# Patient Record
Sex: Female | Born: 1955 | Race: White | Hispanic: No | Smoking: Former smoker
Health system: Southern US, Community
[De-identification: ages and names within clinical notes are randomized; demographics above are authoritative.]

## PROBLEM LIST (undated history)

## (undated) DIAGNOSIS — M797 Fibromyalgia: Secondary | ICD-10-CM

## (undated) DIAGNOSIS — F32A Depression, unspecified: Secondary | ICD-10-CM

## (undated) DIAGNOSIS — F329 Major depressive disorder, single episode, unspecified: Secondary | ICD-10-CM

## (undated) DIAGNOSIS — IMO0002 Reserved for concepts with insufficient information to code with codable children: Secondary | ICD-10-CM

## (undated) DIAGNOSIS — K589 Irritable bowel syndrome without diarrhea: Secondary | ICD-10-CM

## (undated) DIAGNOSIS — B1920 Unspecified viral hepatitis C without hepatic coma: Secondary | ICD-10-CM

## (undated) DIAGNOSIS — G43709 Chronic migraine without aura, not intractable, without status migrainosus: Secondary | ICD-10-CM

## (undated) DIAGNOSIS — F419 Anxiety disorder, unspecified: Secondary | ICD-10-CM

## (undated) HISTORY — DX: Irritable bowel syndrome, unspecified: K58.9

## (undated) HISTORY — DX: Depression, unspecified: F32.A

## (undated) HISTORY — DX: Fibromyalgia: M79.7

## (undated) HISTORY — DX: Reserved for concepts with insufficient information to code with codable children: IMO0002

## (undated) HISTORY — PX: TUBAL LIGATION: SHX77

## (undated) HISTORY — PX: ABDOMINAL HYSTERECTOMY: SHX81

## (undated) HISTORY — DX: Unspecified viral hepatitis C without hepatic coma: B19.20

## (undated) HISTORY — DX: Anxiety disorder, unspecified: F41.9

## (undated) HISTORY — DX: Major depressive disorder, single episode, unspecified: F32.9

## (undated) HISTORY — DX: Chronic migraine without aura, not intractable, without status migrainosus: G43.709

---

## 1998-04-20 ENCOUNTER — Other Ambulatory Visit: Admission: RE | Admit: 1998-04-20 | Discharge: 1998-04-20 | Payer: Self-pay | Admitting: *Deleted

## 1999-03-08 ENCOUNTER — Other Ambulatory Visit: Admission: RE | Admit: 1999-03-08 | Discharge: 1999-03-08 | Payer: Self-pay | Admitting: *Deleted

## 1999-06-11 ENCOUNTER — Ambulatory Visit (HOSPITAL_BASED_OUTPATIENT_CLINIC_OR_DEPARTMENT_OTHER): Admission: RE | Admit: 1999-06-11 | Discharge: 1999-06-11 | Payer: Self-pay | Admitting: Oral Surgery

## 2000-09-15 ENCOUNTER — Other Ambulatory Visit: Admission: RE | Admit: 2000-09-15 | Discharge: 2000-09-15 | Payer: Self-pay | Admitting: Obstetrics and Gynecology

## 2002-01-11 ENCOUNTER — Encounter: Admission: RE | Admit: 2002-01-11 | Discharge: 2002-01-11 | Payer: Self-pay | Admitting: Family Medicine

## 2002-01-11 ENCOUNTER — Encounter: Payer: Self-pay | Admitting: Family Medicine

## 2005-02-19 ENCOUNTER — Ambulatory Visit (HOSPITAL_COMMUNITY): Admission: RE | Admit: 2005-02-19 | Discharge: 2005-02-19 | Payer: Self-pay | Admitting: Gastroenterology

## 2005-03-11 ENCOUNTER — Other Ambulatory Visit: Admission: RE | Admit: 2005-03-11 | Discharge: 2005-03-11 | Payer: Self-pay | Admitting: Obstetrics & Gynecology

## 2006-01-07 ENCOUNTER — Emergency Department (HOSPITAL_COMMUNITY): Admission: EM | Admit: 2006-01-07 | Discharge: 2006-01-07 | Payer: Self-pay | Admitting: Emergency Medicine

## 2009-10-08 ENCOUNTER — Encounter: Admission: RE | Admit: 2009-10-08 | Discharge: 2009-10-08 | Payer: Self-pay | Admitting: Orthopedic Surgery

## 2011-02-15 NOTE — Op Note (Signed)
[name], [name]               ACCOUNT NO.:  [id number]   MEDICAL RECORD NO.:  [id number]          PATIENT TYPE:  AMB   LOCATION:  ENDO                         FACILITY:  [facility]   PHYSICIAN:  Bernette Redbird, M.D.   DATE OF BIRTH:  1955-12-02   DATE OF PROCEDURE:  02/19/2005  DATE OF DISCHARGE:                                 OPERATIVE REPORT   PROCEDURE:  Colonoscopy.   INDICATIONS:  Shelley [name] is a 55 year old female with history of some IBS  symptoms and need for her initial colon cancer screening set examination.   FINDINGS:  Normal exam to the terminal ileum.   PROCEDURE:  The nature, purpose and risks of the procedure had been  discussed with the patient. She provided written consent.   The patient is on Zoloft and Xanax as an outpatient and required quite a bit  of medication for the procedure but was not difficult to sedate apart from  the large medication required. Sedation totaled f Phenergan 25 milligrams  IV, fentanyl 125 mcg IV, and Versed 13 milligrams IV without arrhythmias or  desaturation.   The Olympus adjustable tension pediatric video colonoscope was advanced to  the terminal ileum without significant difficulty and pullback was then  performed. The quality of the prep was very good it is felt that all areas  were well seen. We did turn the patient into the supine position and apply  external abdominal compression to facilitate advancement.   This was a normal examination. No polyps, cancer, colitis, vascular  malformations, or diverticulosis were noted and retroflexion of the rectum  and re-inspection of the rectum were unremarkable. No biopsies were  obtained. The patient tolerated the procedure well and there were no  apparent complications.   IMPRESSION:  Normal screening exam (V76.51).   PLAN:  Flexible sigmoidoscopy in 5 years for continued screening.      RB/MEDQ  D:  02/19/2005  T:  02/19/2005  Job:  [id number]   cc:   Vale Haven. Andrey Campanile, M.D.  [address]  [place]  [place] [address]  Fax: [phone]   Janine Limbo, M.D.  [address], [address]  [place]  [place] [address]  Fax: [phone]

## 2012-08-19 ENCOUNTER — Other Ambulatory Visit: Payer: Self-pay | Admitting: Family Medicine

## 2013-04-08 ENCOUNTER — Ambulatory Visit: Payer: Medicare Other

## 2013-04-08 ENCOUNTER — Ambulatory Visit (INDEPENDENT_AMBULATORY_CARE_PROVIDER_SITE_OTHER): Payer: Medicare Other | Admitting: Internal Medicine

## 2013-04-08 VITALS — BP 116/80 | HR 98 | Temp 98.2°F | Resp 16 | Ht 67.0 in | Wt 135.2 lb

## 2013-04-08 DIAGNOSIS — N10 Acute tubulo-interstitial nephritis: Secondary | ICD-10-CM

## 2013-04-08 DIAGNOSIS — B1920 Unspecified viral hepatitis C without hepatic coma: Secondary | ICD-10-CM | POA: Insufficient documentation

## 2013-04-08 DIAGNOSIS — K589 Irritable bowel syndrome without diarrhea: Secondary | ICD-10-CM | POA: Insufficient documentation

## 2013-04-08 DIAGNOSIS — M549 Dorsalgia, unspecified: Secondary | ICD-10-CM

## 2013-04-08 DIAGNOSIS — R339 Retention of urine, unspecified: Secondary | ICD-10-CM

## 2013-04-08 DIAGNOSIS — M797 Fibromyalgia: Secondary | ICD-10-CM | POA: Insufficient documentation

## 2013-04-08 DIAGNOSIS — F329 Major depressive disorder, single episode, unspecified: Secondary | ICD-10-CM | POA: Insufficient documentation

## 2013-04-08 DIAGNOSIS — F32A Depression, unspecified: Secondary | ICD-10-CM | POA: Insufficient documentation

## 2013-04-08 DIAGNOSIS — R110 Nausea: Secondary | ICD-10-CM

## 2013-04-08 DIAGNOSIS — R109 Unspecified abdominal pain: Secondary | ICD-10-CM

## 2013-04-08 DIAGNOSIS — F419 Anxiety disorder, unspecified: Secondary | ICD-10-CM | POA: Insufficient documentation

## 2013-04-08 DIAGNOSIS — K5900 Constipation, unspecified: Secondary | ICD-10-CM

## 2013-04-08 LAB — POCT CBC
Hemoglobin: 15.2 g/dL (ref 12.2–16.2)
MCH, POC: 31.5 pg — AB (ref 27–31.2)
MCV: 99.7 fL — AB (ref 80–97)
MPV: 9 fL (ref 0–99.8)
RBC: 4.82 M/uL (ref 4.04–5.48)
WBC: 10.6 10*3/uL — AB (ref 4.6–10.2)

## 2013-04-08 LAB — POCT UA - MICROSCOPIC ONLY
Casts, Ur, LPF, POC: NEGATIVE
Crystals, Ur, HPF, POC: NEGATIVE
Yeast, UA: NEGATIVE

## 2013-04-08 LAB — POCT URINALYSIS DIPSTICK
Bilirubin, UA: NEGATIVE
Glucose, UA: NEGATIVE
Nitrite, UA: POSITIVE
Spec Grav, UA: 1.01

## 2013-04-08 MED ORDER — CIPROFLOXACIN HCL 500 MG PO TABS: 500.0000 mg | ORAL_TABLET | Freq: Two times a day (BID) | ORAL | Status: DC

## 2013-04-08 NOTE — Progress Notes (Signed)
Subjective:    Patient ID: Shelley [name], female    DOB: [birth year], 57 y.o.   MRN: [id number]  Chief Complaint  Patient presents with  . Back Pain  . Abdominal Pain  . hard to urninate   HPI Shelley [name] is a 57 y.o. female presenting for abdominal pain and back pain since last night.  She felt bloated like she "needed to pass gas so bad" after getting out of jacuzzi.  Most severe in RLQ and wraps around her side to her back.  Does not extend into her groin.  Described as "stabbing."  Worse when she bends over.  Feels better when she lays down.  She is nauseated.    She is also experiencing urinary hesitancy and retention.  She has noticed a strong odor to urine.  Says her urine changes from dark to light yellow in color.    She battles constipation.  Uses laxatives and suppositories 1-2 times per week.  She had a bowel movement this morning but "not a good one."  Chronic narcotic use.    Denies fever, vomiting, dysuria, urinary frequency, urinary urgency.  20 year pack history. Denies alcohol and drug use.  On disability.  Past Medical History  Diagnosis Date  . Anxiety   . Depression   . Chronic migraine   . Hepatitis C   . IBS (irritable bowel syndrome)   . Fibromyalgia    Past Surgical History  Procedure Laterality Date  . Tubal ligation    . Abdominal hysterectomy     Prior to Admission medications   Medication Sig Start Date End Date Taking? Authorizing Provider  diazepam (VALIUM) 10 MG tablet Take 10 mg by mouth 4 (four) times daily.   Yes Historical Provider, MD  meperidine (DEMEROL) 50 MG tablet Take 50 mg by mouth as needed (migraine).   Yes Historical Provider, MD  morphine (MS CONTIN) 15 MG 12 hr tablet Take 15 mg by mouth 3 (three) times daily.   Yes Historical Provider, MD  promethazine (PHENERGAN) 25 MG tablet Take 25 mg by mouth as needed (migrane).   Yes Historical Provider, MD  sertraline (ZOLOFT) 100 MG tablet Take 200 mg by mouth daily.   Yes Historical  Provider, MD   Allergies  Allergen Reactions  . Penicillins Anaphylaxis  . Codeine Rash    Review of Systems As stated in HPI - otherwise negative.     Objective:   Physical Exam Filed Vitals:   04/08/13 1138  BP: 116/80  Pulse: 98  Temp: 98.2 F (36.8 C)  TempSrc: Oral  Resp: 16  Height: 5\' 7"  (1.702 m)  Weight: 135 lb 3.2 oz (61.326 kg)  SpO2: 97%   General:  WDWN female in no acute distress. Skin:  Soft, warm skin with good turgor.  No bruising or lesions present.  Cardiovascular: S1 and S2 distinct with no murmurs, rubs or gallops. Respiratory:  CTA with no adventitious sounds. Abdominal:  Soft abdomen with no visible pulsations or masses.  Bowel sounds adequate.  No bruits.  TTP in RLQ without rebound.  Positive CVA tenderness on right.  Negative murphy's sign.     [facility] reading (PRIMARY) by  Dr. Perrin Maltese.  Stool burden.  No kidney stones visible.     Assessment & Plan:   1. Abdominal pain - POCT CBC  2. Urinary retention - POCT UA - Microscopic Only - POCT CBC - POCT urinalysis dipstick  3. Nausea alone  4. Back pain - related to pyelonephritis -  DG Abd 1 View; Future  5. Acute pyelonephritis - pt to in 48h if no better sooner if worse - continue good hydration - ciprofloxacin (CIPRO) 500 MG tablet; Take 1 tablet (500 mg total) by mouth 2 (two) times daily.  Dispense: 20 tablet; Refill: 0  6. Constipation - 2nd to chronic narcotic use - suggested daily miralax or other methods to prevent constipation

## 2013-04-08 NOTE — Progress Notes (Signed)
I was directly involved with the patient care including the diagnosis and treatment plan.

## 2013-04-12 ENCOUNTER — Telehealth: Payer: Self-pay

## 2013-04-12 ENCOUNTER — Ambulatory Visit (INDEPENDENT_AMBULATORY_CARE_PROVIDER_SITE_OTHER): Payer: Medicare Other | Admitting: Family Medicine

## 2013-04-12 VITALS — BP 110/58 | HR 95 | Temp 97.5°F | Resp 18 | Ht 66.5 in | Wt 135.0 lb

## 2013-04-12 DIAGNOSIS — D7589 Other specified diseases of blood and blood-forming organs: Secondary | ICD-10-CM

## 2013-04-12 DIAGNOSIS — R3129 Other microscopic hematuria: Secondary | ICD-10-CM

## 2013-04-12 DIAGNOSIS — R109 Unspecified abdominal pain: Secondary | ICD-10-CM

## 2013-04-12 LAB — POCT CBC
HCT, POC: 50.4 % — AB (ref 37.7–47.9)
Lymph, poc: 2.7 (ref 0.6–3.4)
MCH, POC: 32.2 pg — AB (ref 27–31.2)
MCHC: 32.1 g/dL (ref 31.8–35.4)
POC Granulocyte: 6 (ref 2–6.9)
POC LYMPH PERCENT: 28.5 %L (ref 10–50)
RDW, POC: 13.9 %

## 2013-04-12 LAB — POCT UA - MICROSCOPIC ONLY
Casts, Ur, LPF, POC: NEGATIVE
Crystals, Ur, HPF, POC: NEGATIVE
Epithelial cells, urine per micros: NEGATIVE
Yeast, UA: NEGATIVE

## 2013-04-12 LAB — POCT URINALYSIS DIPSTICK
Ketones, UA: NEGATIVE
Leukocytes, UA: NEGATIVE
Protein, UA: NEGATIVE
Urobilinogen, UA: 0.2

## 2013-04-12 NOTE — Progress Notes (Addendum)
[facility] [address] [place] [address] [phone]  Date:  04/12/2013   Name:  Shelley [name]   DOB:  1955-11-11   MRN:  [id number]  PCP:  No primary provider on file.    Chief Complaint: Follow-up   History of Present Illness:  Shelley [name] is a 57 y.o. very pleasant female patient who presents with the following:  She was here on 04/08/13 with back back and abdominal pain- she was diagnosed with pyelonephritis, and was treated with cipro.  I do not see a urine culture on her chart.  Here today for a recheck- she was instructed to come in if not well.   She notes that she still has some abdominal pain, and some back pain.  However she is "a whole lot better."   She no longer notes any dysuria.   No fever or chills, she is eating well.  No nausea or vomiting   No history of abdominal surgery except for hysterectomy.   Her constipation has improved with treatment. She is using miralax and colace at home with success   She was also told that she had hematuria and a UTI in December- admits she did not take the whole course of abx at that time.  She does not think a urine culture was done then  She has been a smoker for some time.    Patient Active Problem List   Diagnosis Date Noted  . Fibromyalgia 04/08/2013  . Anxiety 04/08/2013  . Depression 04/08/2013  . Hepatitis C 04/08/2013  . IBS (irritable bowel syndrome) 04/08/2013    Past Medical History  Diagnosis Date  . Anxiety   . Depression   . Chronic migraine   . Hepatitis C   . IBS (irritable bowel syndrome)   . Fibromyalgia     Past Surgical History  Procedure Laterality Date  . Tubal ligation    . Abdominal hysterectomy      History  Substance Use Topics  . Smoking status: Current Every Day Smoker -- 1.00 packs/day for 20 years  . Smokeless tobacco: Not on file     Comment: uses smoke stick, just switched to half pack a day 7.10.14  . Alcohol Use: No    Family History   Problem Relation Age of Onset  . Cancer Mother   . Stroke Sister   . Hypertension Sister   . Hypertension Brother   . Diabetes Brother     Allergies  Allergen Reactions  . Penicillins Anaphylaxis  . Codeine Rash    Medication list has been reviewed and updated.  Current Outpatient Prescriptions on File Prior to Visit  Medication Sig Dispense Refill  . ciprofloxacin (CIPRO) 500 MG tablet Take 1 tablet (500 mg total) by mouth 2 (two) times daily.  20 tablet  0  . diazepam (VALIUM) 10 MG tablet Take 10 mg by mouth 4 (four) times daily.      . meperidine (DEMEROL) 50 MG tablet Take 50 mg by mouth as needed (migraine).      . morphine (MS CONTIN) 15 MG 12 hr tablet Take 15 mg by mouth 3 (three) times daily.      . promethazine (PHENERGAN) 25 MG tablet Take 25 mg by mouth as needed (migrane).      . sertraline (ZOLOFT) 100 MG tablet Take 200 mg by mouth daily.       No current facility-administered medications on file prior to visit.    Review of  Systems:  As per HPI- otherwise negative.   Physical Examination: Filed Vitals:   04/12/13 1354  BP: 110/58  Pulse: 95  Temp: 97.5 F (36.4 C)  Resp: 18   Filed Vitals:   04/12/13 1354  Height: 5' 6.5" (1.689 m)  Weight: 135 lb (61.236 kg)   Body mass index is 21.47 kg/(m^2). Ideal Body Weight: Weight in (lb) to have BMI = 25: 156.9  GEN: WDWN, NAD, Non-toxic, A & O x 3, looks well HEENT: Atraumatic, Normocephalic. Neck supple. No masses, No LAD. Ears and Nose: No external deformity. CV: RRR, No M/G/R. No JVD. No thrill. No extra heart sounds. PULM: CTA B, no wheezes, crackles, rhonchi. No retractions. No resp. distress. No accessory muscle use. ABD: S,  ND, +BS. No rebound. No HSM.  With some effort she is able to locate an area of tenderness in her right mid- abdomen. However, it is difficult for her to locate and her exam is benign with no significant tenderness to palpation EXTR: No c/c/e NEURO Normal gait.  PSYCH:  Normally interactive. Conversant. Not depressed or anxious appearing.  Calm demeanor.   Results for orders placed in visit on 04/12/13  POCT CBC      Result Value Range   WBC 9.3  4.6 - 10.2 K/uL   Lymph, poc 2.7  0.6 - 3.4   POC LYMPH PERCENT 28.5  10 - 50 %L   MID (cbc) 0.6  0 - 0.9   POC MID % 6.5  0 - 12 %M   POC Granulocyte 6.0  2 - 6.9   Granulocyte percent 65.0  37 - 80 %G   RBC 5.03  4.04 - 5.48 M/uL   Hemoglobin 16.2  12.2 - 16.2 g/dL   HCT, POC 44.0 (*) 10.2 - 47.9 %   MCV 100.1 (*) 80 - 97 fL   MCH, POC 32.2 (*) 27 - 31.2 pg   MCHC 32.1  31.8 - 35.4 g/dL   RDW, POC 72.5     Platelet Count, POC 188  142 - 424 K/uL   MPV 8.8  0 - 99.8 fL  POCT UA - MICROSCOPIC ONLY      Result Value Range   WBC, Ur, HPF, POC 0-2     RBC, urine, microscopic 0-3     Bacteria, U Microscopic neg     Mucus, UA neg     Epithelial cells, urine per micros neg     Crystals, Ur, HPF, POC neg     Casts, Ur, LPF, POC neg     Yeast, UA neg    POCT URINALYSIS DIPSTICK      Result Value Range   Color, UA yellow     Clarity, UA clear     Glucose, UA neg     Bilirubin, UA neg     Ketones, UA neg     Spec Grav, UA <=1.005     Blood, UA trace-lysed     pH, UA 5.5     Protein, UA neg     Urobilinogen, UA 0.2     Nitrite, UA neg     Leukocytes, UA Negative      Assessment and Plan: Abdominal  pain, other specified site - Plan: POCT CBC, POCT UA - Microscopic Only, POCT urinalysis dipstick, Urine culture, POCT UA - Microscopic Only, POCT urinalysis dipstick  Microhematuria  Macrocytosis - Plan: Vitamin B12, Folate  Will check urine culture.  Explained that she does still have microhematuria, and  that her smoking history increases her risk for bladder cancer.  Offered a recheck UA/ micro in a couple of weeks vs referral to urology. For now she prefers to recheck her UA and then decide.  Asked her to keep a close eye on her condition and let me know if not continuing to improve.  Discussed the  remote possibility that she could have appendicitis with her slight RLQ discomfort.  At this time she prefers not to do a CT, but will let me know if she does not continue to improve steadily.   Follow- up with urine culture.   Ordered recheck UA/ micro for her to do in 2 or 3 weeks Finish cipro.    Signed Abbe Amsterdam, MD  7/17- received B12 and folate- both ok.  Called and discussed with her.  Her hematacrit is a bit high- this is not a new problem for her.  She has been told to take aspirin in the past, and has been told that her HCT is high due to smoking.  She cannot donate blood due  To Hep c.  Will send letter to her- I cannot edit, but she is aware she cannot give blood.

## 2013-04-12 NOTE — Telephone Encounter (Signed)
PT STATES SHE WAS SEEN BY DR GUEST AND IS STILL IN A LOT OF PAIN. WOULD LIKE TO SPEAK WITH SOMEONE. PLEASE CALL [phone]

## 2013-04-12 NOTE — Patient Instructions (Addendum)
I will be in touch with your urine culture.   Continue to take the cipro as directed.

## 2013-04-12 NOTE — Telephone Encounter (Signed)
Called her to advise Dr Perrin Maltese has indicated if not improved to return, called her to advise.

## 2013-04-13 LAB — FOLATE: Folate: 12.6 ng/mL

## 2013-04-14 LAB — URINE CULTURE: Colony Count: NO GROWTH

## 2013-04-15 ENCOUNTER — Encounter: Payer: Self-pay | Admitting: Family Medicine

## 2013-04-15 NOTE — Addendum Note (Signed)
Addended by: Abbe Amsterdam C on: 04/15/2013 03:11 PM   Modules accepted: Orders

## 2013-07-06 ENCOUNTER — Telehealth: Payer: Self-pay

## 2013-07-06 DIAGNOSIS — R319 Hematuria, unspecified: Secondary | ICD-10-CM

## 2013-07-06 NOTE — Telephone Encounter (Signed)
Your note indicates repeat of labs for hematuria, patient prefers urology referral, is this okay? pended

## 2013-07-06 NOTE — Telephone Encounter (Signed)
Pt was seen by dr copland a few months ago and a urologist referral was discussed, pt would like to proceed with that referral

## 2013-07-06 NOTE — Telephone Encounter (Signed)
This is fine, signed referral for her.

## 2014-07-05 ENCOUNTER — Encounter (HOSPITAL_COMMUNITY): Payer: Self-pay | Admitting: Emergency Medicine

## 2014-07-05 ENCOUNTER — Inpatient Hospital Stay (HOSPITAL_COMMUNITY)
Admission: EM | Admit: 2014-07-05 | Discharge: 2014-07-09 | DRG: 872 | Disposition: A | Discharge status: Home / Self Care | Payer: Medicare Other | Attending: Internal Medicine | Admitting: Internal Medicine

## 2014-07-05 ENCOUNTER — Emergency Department (HOSPITAL_COMMUNITY): Payer: Medicare Other

## 2014-07-05 DIAGNOSIS — M797 Fibromyalgia: Secondary | ICD-10-CM

## 2014-07-05 DIAGNOSIS — N12 Tubulo-interstitial nephritis, not specified as acute or chronic: Secondary | ICD-10-CM | POA: Diagnosis present

## 2014-07-05 DIAGNOSIS — Z79891 Long term (current) use of opiate analgesic: Secondary | ICD-10-CM | POA: Diagnosis not present

## 2014-07-05 DIAGNOSIS — Z90710 Acquired absence of both cervix and uterus: Secondary | ICD-10-CM | POA: Diagnosis not present

## 2014-07-05 DIAGNOSIS — F17211 Nicotine dependence, cigarettes, in remission: Secondary | ICD-10-CM | POA: Diagnosis present

## 2014-07-05 DIAGNOSIS — Z79899 Other long term (current) drug therapy: Secondary | ICD-10-CM | POA: Diagnosis not present

## 2014-07-05 DIAGNOSIS — Z885 Allergy status to narcotic agent status: Secondary | ICD-10-CM

## 2014-07-05 DIAGNOSIS — Z888 Allergy status to other drugs, medicaments and biological substances status: Secondary | ICD-10-CM

## 2014-07-05 DIAGNOSIS — G43909 Migraine, unspecified, not intractable, without status migrainosus: Secondary | ICD-10-CM | POA: Diagnosis present

## 2014-07-05 DIAGNOSIS — B1920 Unspecified viral hepatitis C without hepatic coma: Secondary | ICD-10-CM

## 2014-07-05 DIAGNOSIS — F419 Anxiety disorder, unspecified: Secondary | ICD-10-CM | POA: Diagnosis present

## 2014-07-05 DIAGNOSIS — A419 Sepsis, unspecified organism: Secondary | ICD-10-CM | POA: Diagnosis present

## 2014-07-05 DIAGNOSIS — F329 Major depressive disorder, single episode, unspecified: Secondary | ICD-10-CM | POA: Diagnosis present

## 2014-07-05 DIAGNOSIS — K589 Irritable bowel syndrome without diarrhea: Secondary | ICD-10-CM | POA: Diagnosis present

## 2014-07-05 DIAGNOSIS — F32A Depression, unspecified: Secondary | ICD-10-CM | POA: Diagnosis present

## 2014-07-05 DIAGNOSIS — Z880 Allergy status to penicillin: Secondary | ICD-10-CM

## 2014-07-05 DIAGNOSIS — N390 Urinary tract infection, site not specified: Secondary | ICD-10-CM | POA: Diagnosis present

## 2014-07-05 DIAGNOSIS — D696 Thrombocytopenia, unspecified: Secondary | ICD-10-CM | POA: Diagnosis not present

## 2014-07-05 DIAGNOSIS — F17201 Nicotine dependence, unspecified, in remission: Secondary | ICD-10-CM

## 2014-07-05 LAB — CBC WITH DIFFERENTIAL/PLATELET
BASOS ABS: 0 10*3/uL (ref 0.0–0.1)
BASOS PCT: 0 % (ref 0–1)
EOS ABS: 0 10*3/uL (ref 0.0–0.7)
EOS PCT: 0 % (ref 0–5)
HEMATOCRIT: 42.3 % (ref 36.0–46.0)
Hemoglobin: 14.6 g/dL (ref 12.0–15.0)
Lymphocytes Relative: 10 % — ABNORMAL LOW (ref 12–46)
Lymphs Abs: 1.4 10*3/uL (ref 0.7–4.0)
MCH: 31.8 pg (ref 26.0–34.0)
MCHC: 34.5 g/dL (ref 30.0–36.0)
MCV: 92.2 fL (ref 78.0–100.0)
MONO ABS: 1.1 10*3/uL — AB (ref 0.1–1.0)
Monocytes Relative: 8 % (ref 3–12)
Neutro Abs: 11.1 10*3/uL — ABNORMAL HIGH (ref 1.7–7.7)
Neutrophils Relative %: 82 % — ABNORMAL HIGH (ref 43–77)
Platelets: 155 10*3/uL (ref 150–400)
RBC: 4.59 MIL/uL (ref 3.87–5.11)
RDW: 13.3 % (ref 11.5–15.5)
WBC: 13.7 10*3/uL — ABNORMAL HIGH (ref 4.0–10.5)

## 2014-07-05 LAB — URINE MICROSCOPIC-ADD ON

## 2014-07-05 LAB — URINALYSIS, ROUTINE W REFLEX MICROSCOPIC
Bilirubin Urine: NEGATIVE
GLUCOSE, UA: NEGATIVE mg/dL
KETONES UR: NEGATIVE mg/dL
Nitrite: NEGATIVE
PH: 5 (ref 5.0–8.0)
Protein, ur: 30 mg/dL — AB
Specific Gravity, Urine: 1.015 (ref 1.005–1.030)
Urobilinogen, UA: 0.2 mg/dL (ref 0.0–1.0)

## 2014-07-05 LAB — COMPREHENSIVE METABOLIC PANEL
ALT: 10 U/L (ref 0–35)
AST: 22 U/L (ref 0–37)
Albumin: 3.6 g/dL (ref 3.5–5.2)
Alkaline Phosphatase: 98 U/L (ref 39–117)
Anion gap: 18 — ABNORMAL HIGH (ref 5–15)
BILIRUBIN TOTAL: 0.6 mg/dL (ref 0.3–1.2)
BUN: 9 mg/dL (ref 6–23)
CHLORIDE: 99 meq/L (ref 96–112)
CO2: 18 mEq/L — ABNORMAL LOW (ref 19–32)
Calcium: 8.4 mg/dL (ref 8.4–10.5)
Creatinine, Ser: 0.75 mg/dL (ref 0.50–1.10)
GFR calc Af Amer: >90 mL/min (ref >90)
GFR calc non Af Amer: >90 mL/min (ref >90)
Glucose, Bld: 113 mg/dL — ABNORMAL HIGH (ref 70–99)
Potassium: 4 mEq/L (ref 3.7–5.3)
Sodium: 135 mEq/L — ABNORMAL LOW (ref 137–147)
Total Protein: 7.8 g/dL (ref 6.0–8.3)

## 2014-07-05 LAB — LIPASE, BLOOD: Lipase: 38 U/L (ref 11–59)

## 2014-07-05 LAB — I-STAT CG4 LACTIC ACID, ED: Lactic Acid, Venous: 1.71 mmol/L (ref 0.5–2.2)

## 2014-07-05 LAB — POC OCCULT BLOOD, ED: FECAL OCCULT BLD: POSITIVE — AB

## 2014-07-05 MED ORDER — SODIUM CHLORIDE 0.9 % IV BOLUS (SEPSIS)
1000.0000 mL | Freq: Once | INTRAVENOUS | Status: AC
  Administered 2014-07-05: 1000 mL via INTRAVENOUS

## 2014-07-05 MED ORDER — ONDANSETRON HCL 4 MG/2ML IJ SOLN
4.0000 mg | Freq: Once | INTRAMUSCULAR | Status: AC
  Administered 2014-07-05: 4 mg via INTRAVENOUS
  Filled 2014-07-05: qty 2

## 2014-07-05 MED ORDER — DEXTROSE 5 % IV SOLN
1.0000 g | Freq: Once | INTRAVENOUS | Status: AC
Start: 2014-07-05 — End: 2014-07-05
  Administered 2014-07-05: 1 g via INTRAVENOUS
  Filled 2014-07-05: qty 10

## 2014-07-05 NOTE — ED Notes (Addendum)
Hx of Hep C. Pt reports diarrhea x3 days, LLQ pain radiates to flank started last night 8/10, pt took fleets enema this morning with no relief, saw some pink tinge on tissue paper. Pt also has a migraine, sunglasses on. Last year pt hx of kidney infection. Nausea present. Denies vomiting.

## 2014-07-05 NOTE — ED Provider Notes (Signed)
CSN: [id number]     Arrival date & time 07/05/14  1753 History   First MD Initiated Contact with Patient 07/05/14 1906     Chief Complaint  Patient presents with  . Abdominal Pain     (Consider location/radiation/quality/duration/timing/severity/associated sxs/prior Treatment) HPI 58 year old female with left lower quadrant and left flank pain over the past 1 day. She had diarrhea a couple days ago and then recently went to the bathroom with a very hard stool. She has history of constipation so she put in an enema. She states the enema never came back out which got her concerned. The patient has noted fever and chills starting today. Nausea without vomiting. No dysuria or hematuria. The pain in her abdomen and flanks feel like the prior kidney infection she had last year. She never had dysuria that time either. She states occasionally she has mucus in her stool recently with some pink tinged stool when she wipes. Currently feels comfortable and denies needing pain at this time.  Past Medical History  Diagnosis Date  . Anxiety   . Depression   . Chronic migraine   . Hepatitis C   . IBS (irritable bowel syndrome)   . Fibromyalgia    Past Surgical History  Procedure Laterality Date  . Tubal ligation    . Abdominal hysterectomy     Family History  Problem Relation Age of Onset  . Cancer Mother   . Stroke Sister   . Hypertension Sister   . Hypertension Brother   . Diabetes Brother    History  Substance Use Topics  . Smoking status: Current Every Day Smoker -- 1.00 packs/day for 20 years  . Smokeless tobacco: Not on file     Comment: uses smoke stick, just switched to half pack a day 7.10.14  . Alcohol Use: No   OB History   Grav Para Term Preterm Abortions TAB SAB Ect Mult Living                 Review of Systems  Constitutional: Positive for fever and chills.  Respiratory: Negative for cough and shortness of breath.   Cardiovascular: Negative for chest pain.   Gastrointestinal: Positive for nausea, abdominal pain, diarrhea and constipation. Negative for vomiting.  Genitourinary: Negative for dysuria.  Musculoskeletal: Positive for back pain.  All other systems reviewed and are negative.     Allergies  Penicillins and Codeine  Home Medications   Prior to Admission medications   Medication Sig Start Date End Date Taking? Authorizing Provider  ciprofloxacin (CIPRO) 500 MG tablet Take 1 tablet (500 mg total) by mouth 2 (two) times daily. 04/08/13   Mancel Bale, PA-C  diazepam (VALIUM) 10 MG tablet Take 10 mg by mouth 4 (four) times daily.    Historical Provider, MD  meperidine (DEMEROL) 50 MG tablet Take 50 mg by mouth as needed (migraine).    Historical Provider, MD  morphine (MS CONTIN) 15 MG 12 hr tablet Take 15 mg by mouth 3 (three) times daily.    Historical Provider, MD  promethazine (PHENERGAN) 25 MG tablet Take 25 mg by mouth as needed (migrane).    Historical Provider, MD  sertraline (ZOLOFT) 100 MG tablet Take 200 mg by mouth daily.    Historical Provider, MD   BP 121/65  Pulse 89  Temp(Src) 100.1 F (37.8 C) (Oral)  Resp 16  SpO2 94% Physical Exam  Nursing note and vitals reviewed. Constitutional: She is oriented to person, place, and time. She appears  well-developed and well-nourished. No distress.  HENT:  Head: Normocephalic and atraumatic.  Right Ear: External ear normal.  Left Ear: External ear normal.  Nose: Nose normal.  Eyes: Right eye exhibits no discharge. Left eye exhibits no discharge.  Cardiovascular: Normal rate, regular rhythm and normal heart sounds.   Pulmonary/Chest: Effort normal and breath sounds normal.  Abdominal: Soft. There is tenderness (mild) in the left lower quadrant. There is CVA tenderness (mild, left worse than right).  Genitourinary: Rectal exam shows external hemorrhoid (non-thrombosed, nontender). Rectal exam shows no mass and no tenderness.  Hardly any stool on rectal exam  Neurological:  She is alert and oriented to person, place, and time.  Skin: Skin is warm and dry. She is not diaphoretic.    ED Course  Procedures (including critical care time) Labs Review Labs Reviewed  COMPREHENSIVE METABOLIC PANEL - Abnormal; Notable for the following:    Sodium 135 (*)    CO2 18 (*)    Glucose, Bld 113 (*)    Anion gap 18 (*)    All other components within normal limits  URINALYSIS, ROUTINE W REFLEX MICROSCOPIC - Abnormal; Notable for the following:    Color, Urine AMBER (*)    APPearance CLOUDY (*)    Hgb urine dipstick MODERATE (*)    Protein, ur 30 (*)    Leukocytes, UA MODERATE (*)    All other components within normal limits  URINE MICROSCOPIC-ADD ON - Abnormal; Notable for the following:    Casts HYALINE CASTS (*)    All other components within normal limits  CBC WITH DIFFERENTIAL - Abnormal; Notable for the following:    WBC 13.7 (*)    Neutrophils Relative % 82 (*)    Neutro Abs 11.1 (*)    Lymphocytes Relative 10 (*)    Monocytes Absolute 1.1 (*)    All other components within normal limits  POC OCCULT BLOOD, ED - Abnormal; Notable for the following:    Fecal Occult Bld POSITIVE (*)    All other components within normal limits  LIPASE, BLOOD  CBC WITH DIFFERENTIAL  CBC WITH DIFFERENTIAL  I-STAT CG4 LACTIC ACID, ED    Imaging Review Ct Abdomen Pelvis Wo Contrast  07/05/2014   CLINICAL DATA:  Acute onset of progressively worsening left lower quadrant abdominal pain, starting yesterday at home. Initial encounter.  EXAM: CT ABDOMEN AND PELVIS WITHOUT CONTRAST  TECHNIQUE: Multidetector CT imaging of the abdomen and pelvis was performed following the standard protocol without IV contrast.  COMPARISON:  Abdominal radiograph performed 04/08/2013  FINDINGS: Minimal bibasilar atelectasis is noted.  A 0.9 cm nonspecific hypodensity is noted at the right hepatic lobe; this could reflect a small cyst. The liver and spleen are otherwise unremarkable. The gallbladder is  within normal limits. The pancreas and adrenal glands are unremarkable.  There is minimal left-sided perinephric stranding, and mild thickening of the wall of the left renal pelvis. This could reflect left-sided pyelonephritis, given the patient's symptoms. The right kidney is grossly unremarkable in appearance. There is no evidence of hydronephrosis. No renal or ureteral stones are seen.  No free fluid is identified. The small bowel is unremarkable in appearance. The stomach is within normal limits. No acute vascular abnormalities are seen. Mild scattered calcification is seen along the abdominal aorta and its branches.  The appendix is normal in caliber and contains contrast and air, without evidence for appendicitis. Contrast progresses to the level of the transverse colon. The colon is partially filled with fluid  and air, and is unremarkable in appearance.  The bladder is mildly distended and grossly unremarkable. The patient is status post hysterectomy. The ovaries are relatively symmetric. No suspicious adnexal masses are identified. No inguinal lymphadenopathy is seen.  No acute osseous abnormalities are identified.  IMPRESSION: 1. Minimal left-sided perinephric stranding, and mild thickening of the wall of the left renal pelvis, could reflect mild left-sided pyelonephritis, given the patient's symptoms. 2. Question of tiny hepatic cyst. 3. Mild scattered calcification along the abdominal aorta and its branches.   Electronically Signed   By: Garald Balding M.D.   On: 07/05/2014 22:43     EKG Interpretation None      MDM   Final diagnoses:  Pyelonephritis    Patient is well-appearing here with a mild left-sided abdominal and flank pain. Her CT and urine are consistent with left-sided pyelonephritis. Given her tachycardia, fever, and elevated white blood cell count, she meets criteria for sepsis. After discussion with the patient she prefers to be admitted with IV antibiotics and fluids. Her blood  pressures remained normal. I discussed her case with the hospitalist, will admit for IV antibiotics and supportive care.    Ephraim Hamburger, MD 07/05/14 [phone]

## 2014-07-05 NOTE — ED Notes (Signed)
Pt off the floor to CT.

## 2014-07-05 NOTE — H&P (Signed)
[facility]                                                                                    Patient Demographics  Shelley [name], is a 58 y.o. female  CSN: [id number]  MRN: [id number]  DOB - [birth year]  Admit Date - 07/05/2014  Outpatient Primary MD for the patient is  Melinda Crutch, MD   With History of -  Past Medical History  Diagnosis Date  . Anxiety   . Depression   . Chronic migraine   . Hepatitis C   . IBS (irritable bowel syndrome)   . Fibromyalgia       Past Surgical History  Procedure Laterality Date  . Tubal ligation    . Abdominal hysterectomy      in for   Chief Complaint  Patient presents with  . Abdominal Pain     HPI  Shelley [name]  is a 58 y.o. female, with past medical history significant for hepatitis C, anxiety and IBS presenting with one-day history of left-sided flank pain similar to a pyelonephritis episode on the right that happened around one year ago. However today the patient noticed fever and nausea without vomiting. Patient has been having regular bowel movements and with alternating diarrhea and constipation. No history of cough chest pains or shortness of breath . Patient denies any dysuria or blood in the urine .    Review of Systems    In addition to the HPI above,  No Fever-chills, No Headache, No changes with Vision or hearing, No problems swallowing food or Liquids, No Chest pain, Cough or Shortness of Breath, No Abdominal pain, mild nausea, no vomiting No Blood in stool or Urine, No dysuria, No new skin rashes or bruises, No new joints pains-aches,  No new weakness, tingling, numbness in any extremity, No recent weight gain or loss, No polyuria, polydypsia or polyphagia, No significant Mental Stressors.  A full 10 point Review of Systems was done, except as stated above, all other Review of Systems were negative.   Social History History  Substance Use Topics  . Smoking status: Current Every Day  Smoker -- 1.00 packs/day for 20 years  . Smokeless tobacco: Not on file     Comment: uses smoke stick, just switched to half pack a day 7.10.14  . Alcohol Use: No     Family History Family History  Problem Relation Age of Onset  . Cancer Mother   . Stroke Sister   . Hypertension Sister   . Hypertension Brother   . Diabetes Brother      Prior to Admission medications   Medication Sig Start Date End Date Taking? Authorizing Provider  diazepam (VALIUM) 10 MG tablet Take 10 mg by mouth 4 (four) times daily.   Yes Historical Provider, MD  meperidine (DEMEROL) 50 MG tablet Take 50 mg by mouth as needed (migraine).   Yes Historical Provider, MD  morphine (MS CONTIN) 15 MG 12 hr tablet Take 15 mg by mouth 3 (three) times daily.   Yes Historical Provider, MD  promethazine (PHENERGAN) 25 MG tablet Take 25 mg by mouth as needed (migrane).   Yes Historical  Provider, MD  sertraline (ZOLOFT) 100 MG tablet Take 200 mg by mouth daily.   Yes Historical Provider, MD    Allergies  Allergen Reactions  . Penicillins Anaphylaxis  . Lyrica [Pregabalin] Other (See Comments)    Walks into walls with this med  . Codeine Rash  . Tramadol Nausea And Vomiting and Rash    Physical Exam  Vitals  Blood pressure 111/57, pulse 89, temperature 100.1 F (37.8 C), temperature source Oral, resp. rate 17, SpO2 96.00%.   1. General elderly white lady, very pleasant, in pain  2. Normal affect and insight, Not Suicidal or Homicidal, Awake Alert, Oriented X 3.  3. No F.N deficits grossly, ALL C.Nerves Intact,  4. Ears and Eyes appear Normal, Conjunctivae clear, PERRLA. Moist Oral Mucosa.  5. Supple Neck, No JVD, No cervical lymphadenopathy appriciated, No Carotid Bruits.  6. Symmetrical Chest wall movement, Good air movement bilaterally, CTAB.  7. RRR, No Gallops, Rubs or Murmurs, No Parasternal Heave.  8. Positive Bowel Sounds, Abdomen Soft, positive left lower quadrant tenderness, No organomegaly  appriciated,No rebound -guarding or rigidity./Left CVA tenderness  9.  No Cyanosis, Normal Skin Turgor, No Skin Rash or Bruise.  10. Good muscle tone,  joints appear normal , no effusions, Normal ROM.  11. No Palpable Lymph Nodes in Neck or Axillae    Data Review  CBC  Recent Labs Lab 07/05/14 2028  WBC 13.7*  HGB 14.6  HCT 42.3  PLT 155  MCV 92.2  MCH 31.8  MCHC 34.5  RDW 13.3  LYMPHSABS 1.4  MONOABS 1.1*  EOSABS 0.0  BASOSABS 0.0   ------------------------------------------------------------------------------------------------------------------  Chemistries   Recent Labs Lab 07/05/14 1853  NA 135*  K 4.0  CL 99  CO2 18*  GLUCOSE 113*  BUN 9  CREATININE 0.75  CALCIUM 8.4  AST 22  ALT 10  ALKPHOS 98  BILITOT 0.6   ------------------------------------------------------------------------------------------------------------------ CrCl is unknown because both a height and weight (above a minimum accepted value) are required for this calculation. ------------------------------------------------------------------------------------------------------------------ No results found for this basename: TSH, T4TOTAL, FREET3, T3FREE, THYROIDAB,  in the last 72 hours   Coagulation profile No results found for this basename: INR, PROTIME,  in the last 168 hours ------------------------------------------------------------------------------------------------------------------- No results found for this basename: DDIMER,  in the last 72 hours -------------------------------------------------------------------------------------------------------------------  Cardiac Enzymes No results found for this basename: CK, CKMB, TROPONINI, MYOGLOBIN,  in the last 168 hours ------------------------------------------------------------------------------------------------------------------ No components found with this basename: POCBNP,     ---------------------------------------------------------------------------------------------------------------  Urinalysis    Component Value Date/Time   COLORURINE AMBER* 07/05/2014 1849   APPEARANCEUR CLOUDY* 07/05/2014 1849   LABSPEC 1.015 07/05/2014 1849   PHURINE 5.0 07/05/2014 [place] 07/05/2014 1849   HGBUR MODERATE* 07/05/2014 [place] 07/05/2014 1849   BILIRUBINUR neg 04/12/2013 [place] 07/05/2014 1849   PROTEINUR 30* 07/05/2014 1849   PROTEINUR neg 04/12/2013 1516   UROBILINOGEN 0.2 07/05/2014 1849   UROBILINOGEN 0.2 04/12/2013 1516   NITRITE NEGATIVE 07/05/2014 1849   NITRITE neg 04/12/2013 [place]* 07/05/2014 1849    ----------------------------------------------------------------------------------------------------------------  Imaging results:   Ct Abdomen Pelvis Wo Contrast  07/05/2014   CLINICAL DATA:  Acute onset of progressively worsening left lower quadrant abdominal pain, starting yesterday at home. Initial encounter.  EXAM: CT ABDOMEN AND PELVIS WITHOUT CONTRAST  TECHNIQUE: Multidetector CT imaging of the abdomen and pelvis was performed following the standard protocol without IV contrast.  COMPARISON:  Abdominal radiograph performed 04/08/2013  FINDINGS: Minimal bibasilar atelectasis is noted.  A 0.9 cm nonspecific hypodensity is noted at the right hepatic lobe; this could reflect a small cyst. The liver and spleen are otherwise unremarkable. The gallbladder is within normal limits. The pancreas and adrenal glands are unremarkable.  There is minimal left-sided perinephric stranding, and mild thickening of the wall of the left renal pelvis. This could reflect left-sided pyelonephritis, given the patient's symptoms. The right kidney is grossly unremarkable in appearance. There is no evidence of hydronephrosis. No renal or ureteral stones are seen.  No free fluid is identified. The small bowel is  unremarkable in appearance. The stomach is within normal limits. No acute vascular abnormalities are seen. Mild scattered calcification is seen along the abdominal aorta and its branches.  The appendix is normal in caliber and contains contrast and air, without evidence for appendicitis. Contrast progresses to the level of the transverse colon. The colon is partially filled with fluid and air, and is unremarkable in appearance.  The bladder is mildly distended and grossly unremarkable. The patient is status post hysterectomy. The ovaries are relatively symmetric. No suspicious adnexal masses are identified. No inguinal lymphadenopathy is seen.  No acute osseous abnormalities are identified.  IMPRESSION: 1. Minimal left-sided perinephric stranding, and mild thickening of the wall of the left renal pelvis, could reflect mild left-sided pyelonephritis, given the patient's symptoms. 2. Question of tiny hepatic cyst. 3. Mild scattered calcification along the abdominal aorta and its branches.   Electronically Signed   By: Garald Balding M.D.   On: 07/05/2014 22:43      Assessment & Plan  1. 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 History of IBS and fibromyalgia  Plan  IV Rocephin Check urine culture Pain control   DVT Prophylaxis Lovenox  AM Labs Ordered, also please review Full Orders    Code Status full  Disposition Plan: Home  Time spent in minutes : 32 minutes  Condition GUARDED  @SIGNATURE @

## 2014-07-05 NOTE — ED Notes (Signed)
Pt returned from CT °

## 2014-07-06 DIAGNOSIS — A419 Sepsis, unspecified organism: Secondary | ICD-10-CM | POA: Diagnosis present

## 2014-07-06 DIAGNOSIS — F419 Anxiety disorder, unspecified: Secondary | ICD-10-CM

## 2014-07-06 DIAGNOSIS — F329 Major depressive disorder, single episode, unspecified: Secondary | ICD-10-CM

## 2014-07-06 DIAGNOSIS — M797 Fibromyalgia: Secondary | ICD-10-CM

## 2014-07-06 DIAGNOSIS — B1920 Unspecified viral hepatitis C without hepatic coma: Secondary | ICD-10-CM

## 2014-07-06 DIAGNOSIS — F17201 Nicotine dependence, unspecified, in remission: Secondary | ICD-10-CM

## 2014-07-06 DIAGNOSIS — N12 Tubulo-interstitial nephritis, not specified as acute or chronic: Secondary | ICD-10-CM

## 2014-07-06 LAB — CBC
HCT: 40.7 % (ref 36.0–46.0)
HEMOGLOBIN: 13.9 g/dL (ref 12.0–15.0)
MCH: 31.3 pg (ref 26.0–34.0)
MCHC: 34.2 g/dL (ref 30.0–36.0)
MCV: 91.7 fL (ref 78.0–100.0)
Platelets: 140 10*3/uL — ABNORMAL LOW (ref 150–400)
RBC: 4.44 MIL/uL (ref 3.87–5.11)
RDW: 13.2 % (ref 11.5–15.5)
WBC: 13.7 10*3/uL — ABNORMAL HIGH (ref 4.0–10.5)

## 2014-07-06 LAB — CREATININE, SERUM
Creatinine, Ser: 0.73 mg/dL (ref 0.50–1.10)
GFR calc Af Amer: >90 mL/min (ref >90)
GFR calc non Af Amer: >90 mL/min (ref >90)

## 2014-07-06 MED ORDER — ACETAMINOPHEN 650 MG RE SUPP: 650.0000 mg | Freq: Four times a day (QID) | RECTAL | Status: DC | PRN

## 2014-07-06 MED ORDER — DEXTROSE 5 % IV SOLN
1.0000 g | INTRAVENOUS | Status: DC
  Administered 2014-07-06 – 2014-07-08 (×3): 1 g via INTRAVENOUS
  Filled 2014-07-06 (×3): qty 10

## 2014-07-06 MED ORDER — ENOXAPARIN SODIUM 40 MG/0.4ML ~~LOC~~ SOLN
40.0000 mg | SUBCUTANEOUS | Status: DC
  Administered 2014-07-06 – 2014-07-07 (×2): 40 mg via SUBCUTANEOUS
  Filled 2014-07-06 (×3): qty 0.4

## 2014-07-06 MED ORDER — MEPERIDINE HCL 50 MG PO TABS: 50.0000 mg | ORAL_TABLET | ORAL | Status: DC | PRN

## 2014-07-06 MED ORDER — PROMETHAZINE HCL 25 MG PO TABS: 25.0000 mg | ORAL_TABLET | Freq: Every day | ORAL | Status: DC | PRN

## 2014-07-06 MED ORDER — MORPHINE SULFATE ER 15 MG PO TBCR
15.0000 mg | EXTENDED_RELEASE_TABLET | Freq: Three times a day (TID) | ORAL | Status: DC
Start: 2014-07-06 — End: 2014-07-06

## 2014-07-06 MED ORDER — SERTRALINE HCL 100 MG PO TABS
200.0000 mg | ORAL_TABLET | Freq: Every day | ORAL | Status: DC
  Administered 2014-07-06 – 2014-07-08 (×3): 200 mg via ORAL
  Filled 2014-07-06 (×4): qty 2

## 2014-07-06 MED ORDER — ONDANSETRON HCL 4 MG/2ML IJ SOLN: 4.0000 mg | Freq: Four times a day (QID) | INTRAMUSCULAR | Status: DC | PRN

## 2014-07-06 MED ORDER — ZOLPIDEM TARTRATE 5 MG PO TABS
5.0000 mg | ORAL_TABLET | Freq: Every evening | ORAL | Status: DC | PRN
  Filled 2014-07-06: qty 1

## 2014-07-06 MED ORDER — DIAZEPAM 5 MG PO TABS
10.0000 mg | ORAL_TABLET | Freq: Four times a day (QID) | ORAL | Status: DC
  Administered 2014-07-06 – 2014-07-08 (×12): 10 mg via ORAL
  Filled 2014-07-06 (×12): qty 2

## 2014-07-06 MED ORDER — SODIUM CHLORIDE 0.9 % IV SOLN
INTRAVENOUS | Status: DC
  Administered 2014-07-06 (×2): via INTRAVENOUS

## 2014-07-06 MED ORDER — ACETAMINOPHEN 325 MG PO TABS
650.0000 mg | ORAL_TABLET | Freq: Four times a day (QID) | ORAL | Status: DC | PRN
  Administered 2014-07-06 – 2014-07-08 (×7): 650 mg via ORAL
  Filled 2014-07-06 (×7): qty 2

## 2014-07-06 MED ORDER — MORPHINE SULFATE ER 15 MG PO TBCR
15.0000 mg | EXTENDED_RELEASE_TABLET | Freq: Three times a day (TID) | ORAL | Status: DC
  Administered 2014-07-06 – 2014-07-07 (×4): 15 mg via ORAL
  Filled 2014-07-06 (×5): qty 1

## 2014-07-06 MED ORDER — ONDANSETRON HCL 4 MG PO TABS: 4.0000 mg | ORAL_TABLET | Freq: Four times a day (QID) | ORAL | Status: DC | PRN

## 2014-07-06 MED ORDER — OXYCODONE HCL 5 MG PO TABS
5.0000 mg | ORAL_TABLET | ORAL | Status: DC | PRN
Start: 2014-07-06 — End: 2014-07-09

## 2014-07-06 NOTE — Progress Notes (Signed)
PROGRESS NOTE  Shelley [name] [id number]:[id number] DOB: 04/19/1956 DOA: 07/05/2014 PCP:  Melinda Crutch, MD  HPI: 58 y.o. female, with past medical history significant for hepatitis C, anxiety and IBS presenting with one-day history of left-sided flank pain, found to have pyelonephritis on CT scan.  Subjective/ 24 H Interval events - feeling a bit better this morning, still with some subjective chills - afebrile this morning  Assessment/Plan: Sepsis due to UTI - low grade fever in the ED but febrile at home prior to presentation, with tachycardia, leukocytosis and a source - continue Ceftriaxone, urine cultures pending.  - no history of MDRs Chronic pain and anxiety in the setting of fibromyalgia, continue home regimen Tobacco abuse, in remission - quit E cig a month ago, congratulated patient on abstinence.   Diet: heart healthy Fluids: nNS DVT Prophylaxis: Lovenox  Code Status: Full Family Communication: d/w patient  Disposition Plan: home when ready   Consultants:  None   Procedures:  None    Antibiotics Ceftriaxone 10/6 >>  Studies  Filed Vitals:   07/06/14 0100 07/06/14 0500 07/06/14 0755 07/06/14 1353  BP: 101/46 92/58  100/62  Pulse: 84 67  72  Temp: 99.5 F (37.5 C) 98.2 F (36.8 C)  100.6 F (38.1 C)  TempSrc: Oral Oral  Oral  Resp: 16 18  20   Height:   5\' 6"  (1.676 m)   SpO2: 94% 93%  97%    Intake/Output Summary (Last 24 hours) at 07/06/14 1408 Last data filed at 07/06/14 0532  Gross per 24 hour  Intake 441.67 ml  Output      0 ml  Net 441.67 ml   Exam:  General:  NAD  Cardiovascular: RRR  Respiratory: CTA biL  Abdomen: soft, non tender  MSK: CVA tenderness on left   Data Reviewed: Basic Metabolic Panel:  Recent Labs Lab 07/05/14 1853 07/06/14 0240  NA 135*  --   K 4.0  --   CL 99  --   CO2 18*  --   GLUCOSE 113*  --   BUN 9  --   CREATININE 0.75 0.73  CALCIUM 8.4  --    Liver Function Tests:  Recent Labs Lab 07/05/14 1853   AST 22  ALT 10  ALKPHOS 98  BILITOT 0.6  PROT 7.8  ALBUMIN 3.6    Recent Labs Lab 07/05/14 1853  LIPASE 38   CBC:  Recent Labs Lab 07/05/14 2028 07/06/14 0240  WBC 13.7* 13.7*  NEUTROABS 11.1*  --   HGB 14.6 13.9  HCT 42.3 40.7  MCV 92.2 91.7  PLT 155 140*   Studies: Ct Abdomen Pelvis Wo Contrast 07/05/2014 1. Minimal left-sided perinephric stranding, and mild thickening of the wall of the left renal pelvis, could reflect mild left-sided pyelonephritis, given the patient's symptoms. 2. Question of tiny hepatic cyst. 3. Mild scattered calcification along the abdominal aorta and its branches.  Scheduled Meds: . cefTRIAXone (ROCEPHIN)  IV  1 g Intravenous Q24H  . diazepam  10 mg Oral QID  . enoxaparin (LOVENOX) injection  40 mg Subcutaneous Q24H  . morphine  15 mg Oral 3 times per day  . sertraline  200 mg Oral Daily   Continuous Infusions: . sodium chloride 125 mL/hr at 07/06/14 1218    Principal Problem:   Sepsis Active Problems:   Anxiety   Depression   Pyelonephritis   Tobacco abuse, in remission   Time spent: [place], MD [facility] Pager [phone]. If 7  PM - 7 AM, please contact night-coverage at [email or web address], password [facility] 07/06/2014, 2:08 PM  LOS: 1 day

## 2014-07-06 NOTE — Care Management Note (Addendum)
    Page 1 of 1   07/08/2014     4:30:08 PM CARE MANAGEMENT NOTE 07/08/2014  Patient:  Shelley [name], Shelley [name]   Account Number:  [id number]  Date Initiated:  07/06/2014  Documentation initiated by:  Dessa Phi  Subjective/Objective Assessment:   58 Y/O F ADMITTED W/PYELONEPHRITIS.HX:IBS.     Action/Plan:   FROM HOME.   Anticipated DC Date:  07/09/2014   Anticipated DC Plan:  Halfway  CM consult      Choice offered to / List presented to:             Status of service:  In process, will continue to follow Medicare Important Message given?  YES (If response is "NO", the following Medicare IM given date fields will be blank) Date Medicare IM given:  07/08/2014 Medicare IM given by:  [facility] Date Additional Medicare IM given:   Additional Medicare IM given by:    Discharge Disposition:    Per UR Regulation:  Reviewed for med. necessity/level of care/duration of stay  If discussed at [place] of Stay Meetings, dates discussed:    Comments:  07/06/14 Colbie Sliker RN,BSN NCM [phone] IVF,IV ABX.NO ANTICIPATED D/C NEEDS.

## 2014-07-07 LAB — BASIC METABOLIC PANEL
ANION GAP: 9 (ref 5–15)
BUN: 5 mg/dL — ABNORMAL LOW (ref 6–23)
CALCIUM: 7.9 mg/dL — AB (ref 8.4–10.5)
CO2: 26 mEq/L (ref 19–32)
Chloride: 103 mEq/L (ref 96–112)
Creatinine, Ser: 0.61 mg/dL (ref 0.50–1.10)
GFR calc Af Amer: >90 mL/min (ref >90)
Glucose, Bld: 112 mg/dL — ABNORMAL HIGH (ref 70–99)
Potassium: 3.5 mEq/L — ABNORMAL LOW (ref 3.7–5.3)
Sodium: 138 mEq/L (ref 137–147)

## 2014-07-07 LAB — CBC
HCT: 36.3 % (ref 36.0–46.0)
Hemoglobin: 12.1 g/dL (ref 12.0–15.0)
MCH: 30.9 pg (ref 26.0–34.0)
MCHC: 33.3 g/dL (ref 30.0–36.0)
MCV: 92.6 fL (ref 78.0–100.0)
PLATELETS: 116 10*3/uL — AB (ref 150–400)
RBC: 3.92 MIL/uL (ref 3.87–5.11)
RDW: 13.3 % (ref 11.5–15.5)
WBC: 8.3 10*3/uL (ref 4.0–10.5)

## 2014-07-07 NOTE — Progress Notes (Addendum)
PROGRESS NOTE  Shelley [name] [id number]:[id number] DOB: [birth year] DOA: 07/05/2014 PCP:  Melinda Crutch, MD  HPI: 58 y.o. female, with past medical history significant for hepatitis C, anxiety and IBS presenting with one-day history of left-sided flank pain, found to have pyelonephritis on CT scan.  Subjective/ 24 H Interval events - afebrile this morning but febrile yesterday evening  Assessment/Plan: Sepsis due to UTI - low grade fever in the ED but febrile at home prior to presentation, with tachycardia, leukocytosis and a source - continue Ceftriaxone, urine cultures pending.  - no history of MDRs Chronic pain and anxiety in the setting of fibromyalgia, continue home regimen Thrombocytopenia - likely in the setting of sepsis, monitor, no bleeding.  Tobacco abuse, in remission - quit E cig a month ago, congratulated patient on abstinence.   Diet: heart healthy Fluids: nNS DVT Prophylaxis: Lovenox  Code Status: Full Family Communication: d/w patient  Disposition Plan: home when ready   Consultants:  None   Procedures:  None    Antibiotics Ceftriaxone 10/6 >>  Studies  Filed Vitals:   07/07/14 0619 07/07/14 0742 07/07/14 0954 07/07/14 1128  BP: 97/46 100/42 101/50   Pulse: 68 70 74   Temp: 98.3 F (36.8 C) 98.7 F (37.1 C) 97 F (36.1 C) 99.2 F (37.3 C)  TempSrc: Oral Oral Oral Oral  Resp: 18 20 20    Height:      SpO2: 93% 92% 97%     Intake/Output Summary (Last 24 hours) at 07/07/14 1454 Last data filed at 07/07/14 1031  Gross per 24 hour  Intake 3348.33 ml  Output    700 ml  Net 2648.33 ml   Exam:  General:  NAD  Cardiovascular: RRR  Respiratory: CTA biL  Abdomen: soft, non tender  Data Reviewed: Basic Metabolic Panel:  Recent Labs Lab 07/05/14 1853 07/06/14 0240 07/07/14 0455  NA 135*  --  138  K 4.0  --  3.5*  CL 99  --  103  CO2 18*  --  26  GLUCOSE 113*  --  112*  BUN 9  --  5*  CREATININE 0.75 0.73 0.61  CALCIUM 8.4  --  7.9*    Liver Function Tests:  Recent Labs Lab 07/05/14 1853  AST 22  ALT 10  ALKPHOS 98  BILITOT 0.6  PROT 7.8  ALBUMIN 3.6    Recent Labs Lab 07/05/14 1853  LIPASE 38   CBC:  Recent Labs Lab 07/05/14 2028 07/06/14 0240 07/07/14 0455  WBC 13.7* 13.7* 8.3  NEUTROABS 11.1*  --   --   HGB 14.6 13.9 12.1  HCT 42.3 40.7 36.3  MCV 92.2 91.7 92.6  PLT 155 140* 116*   Studies: Ct Abdomen Pelvis Wo Contrast 07/05/2014 1. Minimal left-sided perinephric stranding, and mild thickening of the wall of the left renal pelvis, could reflect mild left-sided pyelonephritis, given the patient's symptoms. 2. Question of tiny hepatic cyst. 3. Mild scattered calcification along the abdominal aorta and its branches.  Scheduled Meds: . cefTRIAXone (ROCEPHIN)  IV  1 g Intravenous Q24H  . diazepam  10 mg Oral QID  . enoxaparin (LOVENOX) injection  40 mg Subcutaneous Q24H  . morphine  15 mg Oral 3 times per day  . sertraline  200 mg Oral Daily   Continuous Infusions:    Principal Problem:   Sepsis Active Problems:   Anxiety   Depression   Pyelonephritis   Tobacco abuse, in remission   Time spent: [address]  MD [facility] Pager [phone]. If 7 PM - 7 AM, please contact night-coverage at [email or web address], password [facility] 07/07/2014, 2:54 PM  LOS: 2 days

## 2014-07-08 LAB — URINE CULTURE
Colony Count: NO GROWTH
Culture: NO GROWTH

## 2014-07-08 LAB — CBC
HCT: 37.2 % (ref 36.0–46.0)
Hemoglobin: 12.5 g/dL (ref 12.0–15.0)
MCH: 31.2 pg (ref 26.0–34.0)
MCHC: 33.6 g/dL (ref 30.0–36.0)
MCV: 92.8 fL (ref 78.0–100.0)
Platelets: 121 10*3/uL — ABNORMAL LOW (ref 150–400)
RBC: 4.01 MIL/uL (ref 3.87–5.11)
RDW: 13.1 % (ref 11.5–15.5)
WBC: 5.9 10*3/uL (ref 4.0–10.5)

## 2014-07-08 MED ORDER — SENNOSIDES-DOCUSATE SODIUM 8.6-50 MG PO TABS
1.0000 | ORAL_TABLET | Freq: Two times a day (BID) | ORAL | Status: DC
  Administered 2014-07-08 (×2): 1 via ORAL
  Filled 2014-07-08 (×4): qty 1

## 2014-07-08 MED ORDER — MORPHINE SULFATE ER 15 MG PO TBCR: 15.0000 mg | EXTENDED_RELEASE_TABLET | Freq: Three times a day (TID) | ORAL | Status: DC

## 2014-07-08 MED ORDER — MORPHINE SULFATE ER 15 MG PO TBCR
15.0000 mg | EXTENDED_RELEASE_TABLET | Freq: Three times a day (TID) | ORAL | Status: DC
  Administered 2014-07-08 – 2014-07-09 (×3): 15 mg via ORAL
  Filled 2014-07-08 (×4): qty 1

## 2014-07-08 NOTE — Progress Notes (Signed)
PROGRESS NOTE  Shelley [name] [id number]:[id number] DOB: January 07, 1956 DOA: 07/05/2014 PCP:  Melinda Crutch, MD  HPI: 58 y.o. female, with past medical history significant for hepatitis C, anxiety and IBS presenting with one-day history of left-sided flank pain, found to have pyelonephritis on CT scan.  Subjective/ 24 H Interval events - afebrile this morning but febrile yesterday evening  Assessment/Plan:  Sepsis due to UTI and pyelonephritis - low grade fever in the ED but febrile at home prior to presentation, with tachycardia, leukocytosis and a source - continue Ceftriaxone, urine cultures unfortunately sent after antibiotics were started and negative.  - no history of MDRs - will continue iv antibiotics today given sepsis on presentation and negative cultures, will transition to po ciprofloxacin tomorrow  Chronic pain and anxiety in the setting of fibromyalgia, continue home regimen  Thrombocytopenia - likely in the setting of sepsis, monitor, no bleeding. - improving   Tobacco abuse, in remission - quit E cig a month ago, congratulated patient on abstinence.   Diet: heart healthy Fluids: nNS DVT Prophylaxis: SCDs  Code Status: Full Family Communication: d/w patient  Disposition Plan: home when ready   Consultants:  None   Procedures:  None    Antibiotics Ceftriaxone 10/6 >>  Studies  Filed Vitals:   07/07/14 0954 07/07/14 1128 07/07/14 2059 07/08/14 0600  BP: 101/50  111/63 108/59  Pulse: 74  58 63  Temp: 97 F (36.1 C) 99.2 F (37.3 C) 98.7 F (37.1 C) 98.5 F (36.9 C)  TempSrc: Oral Oral Oral Oral  Resp: 20  18 18   Height:      SpO2: 97%  96% 96%    Intake/Output Summary (Last 24 hours) at 07/08/14 1345 Last data filed at 07/08/14 0844  Gross per 24 hour  Intake    290 ml  Output      0 ml  Net    290 ml   Exam:  General:  NAD  Cardiovascular: RRR  Respiratory: CTA biL  Abdomen: soft, non tender  Data Reviewed: Basic Metabolic  Panel:  Recent Labs Lab 07/05/14 1853 07/06/14 0240 07/07/14 0455  NA 135*  --  138  K 4.0  --  3.5*  CL 99  --  103  CO2 18*  --  26  GLUCOSE 113*  --  112*  BUN 9  --  5*  CREATININE 0.75 0.73 0.61  CALCIUM 8.4  --  7.9*   Liver Function Tests:  Recent Labs Lab 07/05/14 1853  AST 22  ALT 10  ALKPHOS 98  BILITOT 0.6  PROT 7.8  ALBUMIN 3.6    Recent Labs Lab 07/05/14 1853  LIPASE 38   CBC:  Recent Labs Lab 07/05/14 2028 07/06/14 0240 07/07/14 0455 07/08/14 0445  WBC 13.7* 13.7* 8.3 5.9  NEUTROABS 11.1*  --   --   --   HGB 14.6 13.9 12.1 12.5  HCT 42.3 40.7 36.3 37.2  MCV 92.2 91.7 92.6 92.8  PLT 155 140* 116* 121*   Studies: Ct Abdomen Pelvis Wo Contrast 07/05/2014 1. Minimal left-sided perinephric stranding, and mild thickening of the wall of the left renal pelvis, could reflect mild left-sided pyelonephritis, given the patient's symptoms. 2. Question of tiny hepatic cyst. 3. Mild scattered calcification along the abdominal aorta and its branches.  Scheduled Meds: . cefTRIAXone (ROCEPHIN)  IV  1 g Intravenous Q24H  . diazepam  10 mg Oral QID  . morphine  15 mg Oral 3 times per day  . senna-docusate  1 tablet Oral BID  . sertraline  200 mg Oral Daily   Continuous Infusions:    Principal Problem:   Sepsis Active Problems:   Anxiety   Depression   Pyelonephritis   Tobacco abuse, in remission   Time spent: [place], MD [facility] Pager [phone]. If 7 PM - 7 AM, please contact night-coverage at [email or web address], password [facility] 07/08/2014, 1:45 PM  LOS: 3 days

## 2014-07-09 MED ORDER — CIPROFLOXACIN HCL 500 MG PO TABS: 500.0000 mg | ORAL_TABLET | Freq: Two times a day (BID) | ORAL | Status: DC

## 2014-07-09 NOTE — Discharge Instructions (Signed)

## 2014-07-09 NOTE — Discharge Summary (Signed)
Physician Discharge Summary  [name]:[id number] DOB: October 11, 1955 DOA: 07/05/2014  PCP:  Melinda Crutch, MD  Admit date: 07/05/2014 Discharge date: 07/09/2014  Time spent: 40 minutes  Recommendations for Outpatient Follow-up:  1. Follow up with PCP in 1-2 weeks   Discharge Diagnoses:  Principal Problem:   Sepsis Active Problems:   Anxiety   Depression   Pyelonephritis   Tobacco abuse, in remission  Discharge Condition: stable  Diet recommendation: regular   Filed Weights   07/08/14 1422  Weight: 63.413 kg (139 lb 12.8 oz)   History of present illness:  Shelley [name] is a 58 y.o. female, with past medical history significant for hepatitis C, anxiety and IBS presenting with one-day history of left-sided flank pain similar to a pyelonephritis episode on the right that happened around one year ago. However today the patient noticed fever and nausea without vomiting. Patient has been having regular bowel movements and with alternating diarrhea and constipation. No history of cough chest pains or shortness of breath . Patient denies any dysuria or blood in the urine .  Hospital Course:  Sepsis due to UTI and pyelonephritis - low grade fever in the ED but febrile at home prior to presentation, with tachycardia, leukocytosis and a source. Patient was started on Ceftriaxone with improvement in her sepsis physiology. Urine cultures unfortunately returned negative. Patient's antibiotics were narrowed to Ciprofloxacin and was discharged home in stable condition to complete 5 additional days. Chronic pain and anxiety in the setting of fibromyalgia, continue home regimen  Thrombocytopenia - likely in the setting of sepsis, monitor, no bleeding, improving, further monitor as outpatient.  Tobacco abuse, in remission - quit E cig a month ago, congratulated patient on abstinence.   Procedures:  None    Consultations:  None   Discharge Exam: Filed Vitals:   07/08/14 2120 07/09/14  0127 07/09/14 0130 07/09/14 0537  BP: 89/42  106/66 109/44  Pulse: 58   57  Temp: 98.3 F (36.8 C)   98.6 F (37 C)  TempSrc: Oral Oral  Oral  Resp: 16 16  18   Height:      Weight:      SpO2: 95% 96%  96%   General: NAD Cardiovascular: RRR Respiratory: CTA biL  Discharge Instructions    Medication List         ciprofloxacin 500 MG tablet  Commonly known as:  CIPRO  Take 1 tablet (500 mg total) by mouth 2 (two) times daily.     DEMEROL 50 MG tablet  Generic drug:  meperidine  Take 50 mg by mouth as needed (migraine).     diazepam 10 MG tablet  Commonly known as:  VALIUM  Take 10 mg by mouth 4 (four) times daily.     morphine 15 MG 12 hr tablet  Commonly known as:  MS CONTIN  Take 15 mg by mouth 3 (three) times daily.     promethazine 25 MG tablet  Commonly known as:  PHENERGAN  Take 25 mg by mouth as needed (migrane).     sertraline 100 MG tablet  Commonly known as:  ZOLOFT  Take 200 mg by mouth daily.           Follow-up Information   Follow up with  Melinda Crutch, MD. Schedule an appointment as soon as possible for a visit in 1 week.   Specialty:  Family Medicine   Contact information:   [address] [place] [address] [phone]  The results of significant diagnostics from this hospitalization (including imaging, microbiology, ancillary and laboratory) are listed below for reference.    Significant Diagnostic Studies: Ct Abdomen Pelvis Wo Contrast  07/05/2014   CLINICAL DATA:  Acute onset of progressively worsening left lower quadrant abdominal pain, starting yesterday at home. Initial encounter.  EXAM: CT ABDOMEN AND PELVIS WITHOUT CONTRAST  TECHNIQUE: Multidetector CT imaging of the abdomen and pelvis was performed following the standard protocol without IV contrast.  COMPARISON:  Abdominal radiograph performed 04/08/2013  FINDINGS: Minimal bibasilar atelectasis is noted.  A 0.9 cm nonspecific hypodensity is noted at the right hepatic  lobe; this could reflect a small cyst. The liver and spleen are otherwise unremarkable. The gallbladder is within normal limits. The pancreas and adrenal glands are unremarkable.  There is minimal left-sided perinephric stranding, and mild thickening of the wall of the left renal pelvis. This could reflect left-sided pyelonephritis, given the patient's symptoms. The right kidney is grossly unremarkable in appearance. There is no evidence of hydronephrosis. No renal or ureteral stones are seen.  No free fluid is identified. The small bowel is unremarkable in appearance. The stomach is within normal limits. No acute vascular abnormalities are seen. Mild scattered calcification is seen along the abdominal aorta and its branches.  The appendix is normal in caliber and contains contrast and air, without evidence for appendicitis. Contrast progresses to the level of the transverse colon. The colon is partially filled with fluid and air, and is unremarkable in appearance.  The bladder is mildly distended and grossly unremarkable. The patient is status post hysterectomy. The ovaries are relatively symmetric. No suspicious adnexal masses are identified. No inguinal lymphadenopathy is seen.  No acute osseous abnormalities are identified.  IMPRESSION: 1. Minimal left-sided perinephric stranding, and mild thickening of the wall of the left renal pelvis, could reflect mild left-sided pyelonephritis, given the patient's symptoms. 2. Question of tiny hepatic cyst. 3. Mild scattered calcification along the abdominal aorta and its branches.   Electronically Signed   By: Garald Balding M.D.   On: 07/05/2014 22:43    Microbiology: Recent Results (from the past 240 hour(s))  URINE CULTURE     Status: None   Collection Time    07/07/14  6:44 AM      Result Value Ref Range Status   Specimen Description URINE, RANDOM   Final   Special Requests NONE   Final   Culture  Setup Time     Final   Value: 07/07/2014 08:55      Performed at [facility]     Final   Value: NO GROWTH     Performed at [facility]   Culture     Final   Value: NO GROWTH     Performed at [facility]   Report Status 07/08/2014 FINAL   Final     Labs: Basic Metabolic Panel:  Recent Labs Lab 07/05/14 1853 07/06/14 0240 07/07/14 0455  NA 135*  --  138  K 4.0  --  3.5*  CL 99  --  103  CO2 18*  --  26  GLUCOSE 113*  --  112*  BUN 9  --  5*  CREATININE 0.75 0.73 0.61  CALCIUM 8.4  --  7.9*   Liver Function Tests:  Recent Labs Lab 07/05/14 1853  AST 22  ALT 10  ALKPHOS 98  BILITOT 0.6  PROT 7.8  ALBUMIN 3.6    Recent Labs  Lab 07/05/14 1853  LIPASE 38   CBC:  Recent Labs Lab 07/05/14 2028 07/06/14 0240 07/07/14 0455 07/08/14 0445  WBC 13.7* 13.7* 8.3 5.9  NEUTROABS 11.1*  --   --   --   HGB 14.6 13.9 12.1 12.5  HCT 42.3 40.7 36.3 37.2  MCV 92.2 91.7 92.6 92.8  PLT 155 140* 116* 121*   Signed:  Likisha Alles  [facility] 07/09/2014, 1:47 PM

## 2014-10-06 IMAGING — CT CT ABD-PELV W/O CM
1 of 2 series · 15 of 32 positions shown, 19 images · non-contrast
Comparison: Abdominal radiograph performed 04/08/2013

CLINICAL DATA: Acute onset of progressively worsening left lower
quadrant abdominal pain, starting yesterday at home. Initial
encounter.

EXAM:
CT ABDOMEN AND PELVIS WITHOUT CONTRAST
TECHNIQUE: Multidetector CT imaging of the abdomen and pelvis was performed
following the standard protocol without IV contrast.

[Series 2: abd/pel w/o · axial · non-contrast · 0.74mm/px · z∈[+1001,+1436]mm · 15 of 95 slices shown, 19 images]
[im 4/95  soft-tissue]
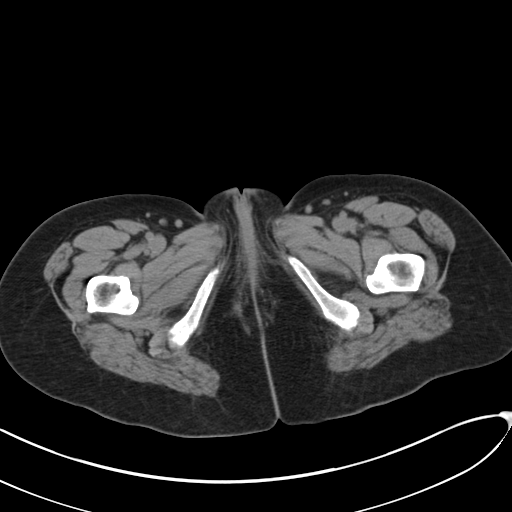
[im 4/95  bone]
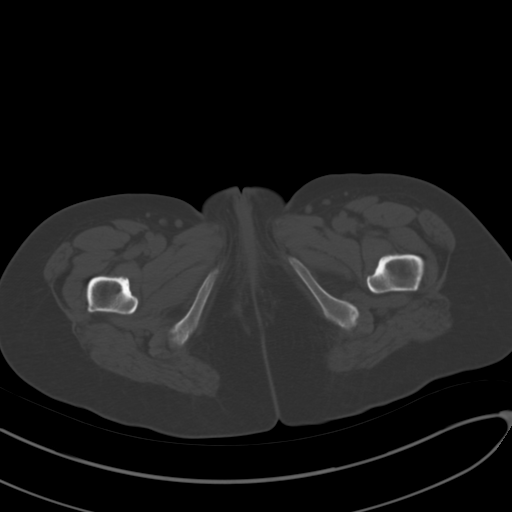
[im 12/95  soft-tissue]
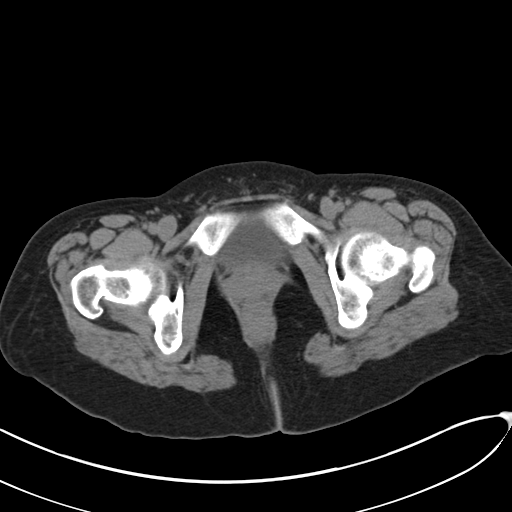
[im 19/95  soft-tissue]
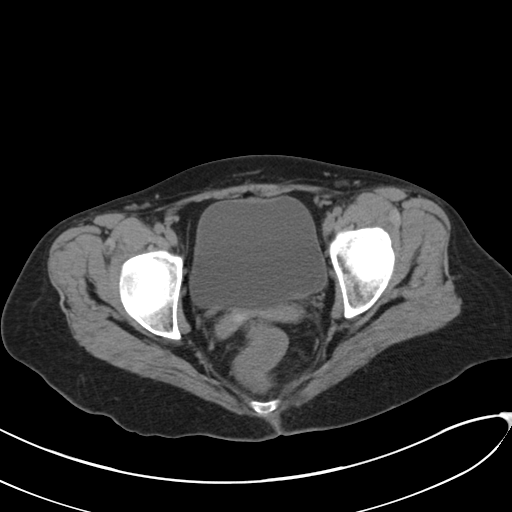
[im 27/95  soft-tissue]
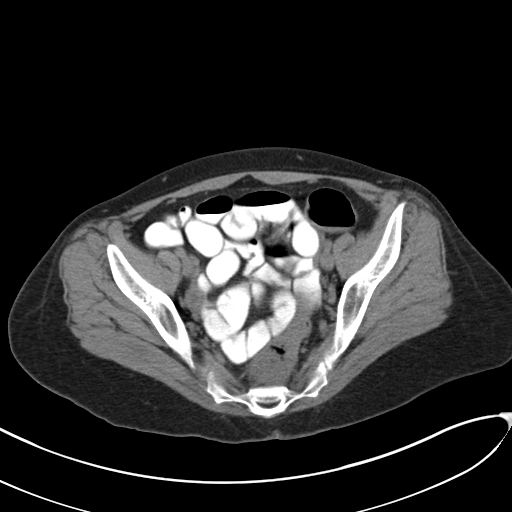
[im 34/95  soft-tissue]
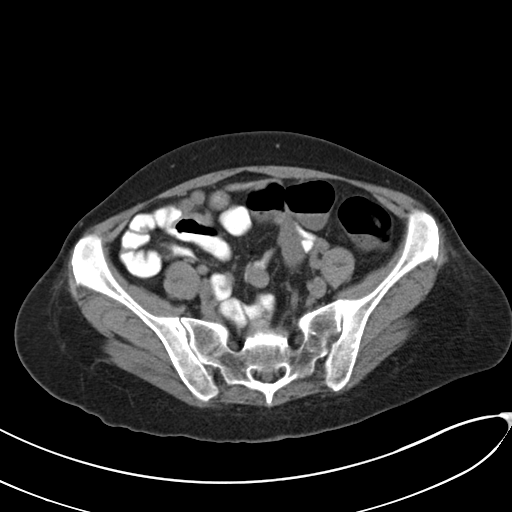
[im 42/95  soft-tissue]
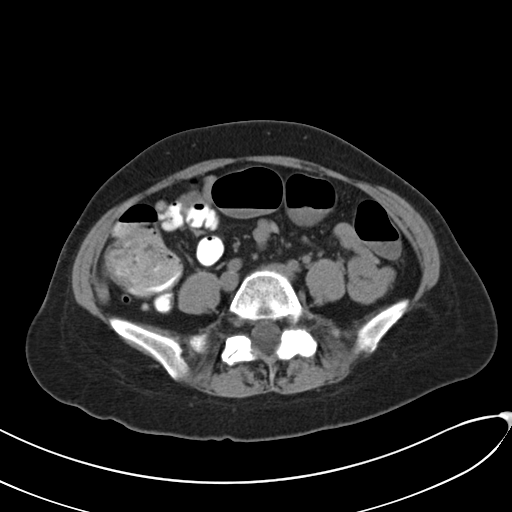
[im 49/95  soft-tissue]
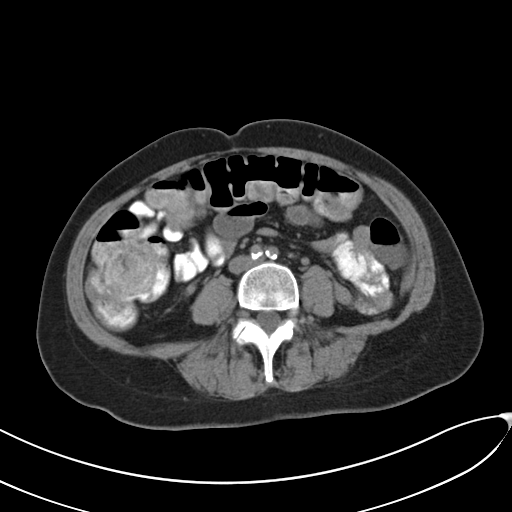
[im 53/95  soft-tissue]
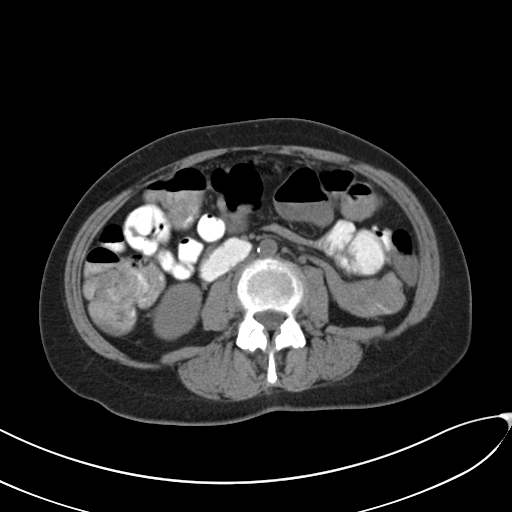
[im 61/95  soft-tissue]
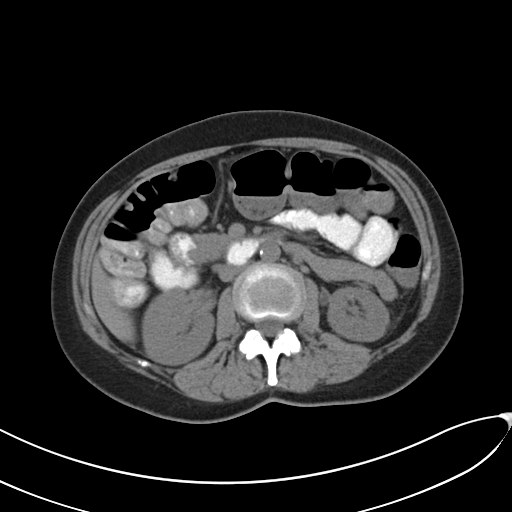
[im 61/95  bone]
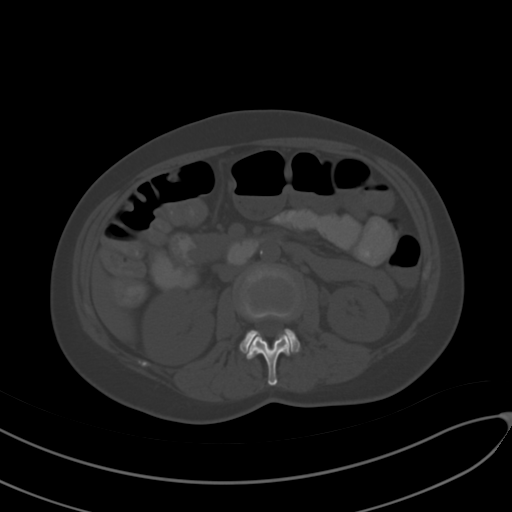
[im 68/95  soft-tissue]
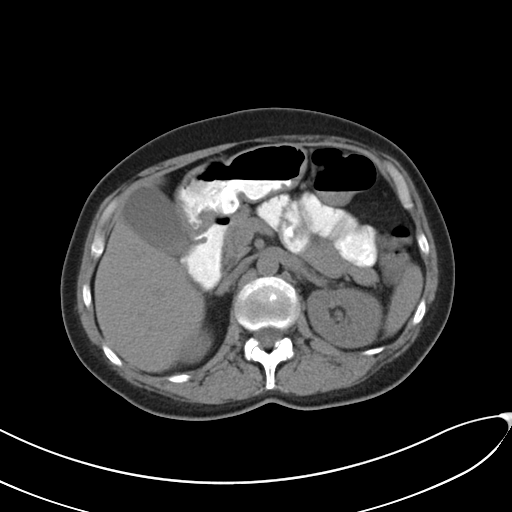
[im 76/95  soft-tissue]
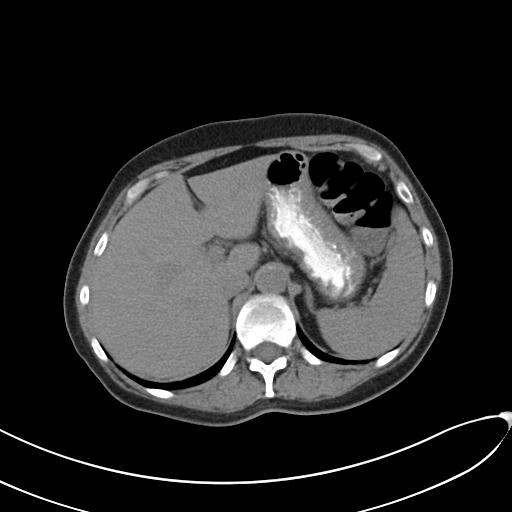
[im 79/95  lung]
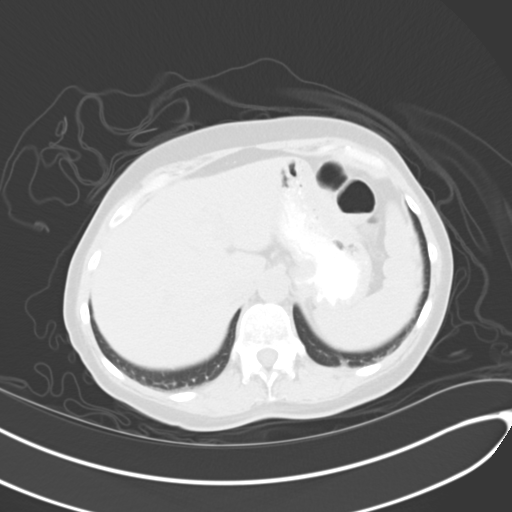
[im 83/95  soft-tissue]
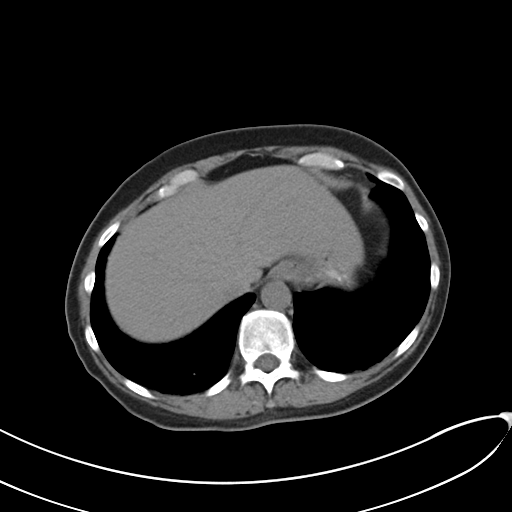
[im 83/95  lung]
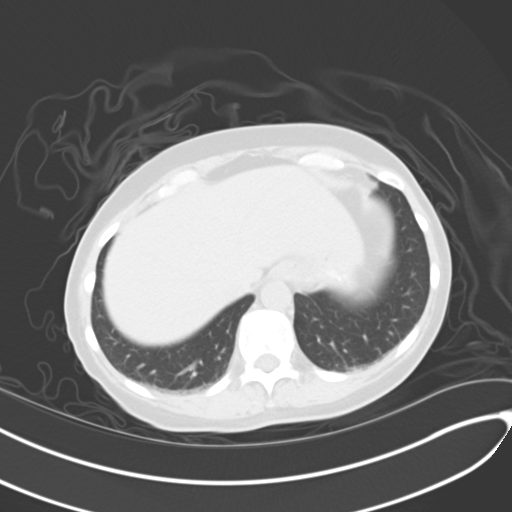
[im 87/95  lung]
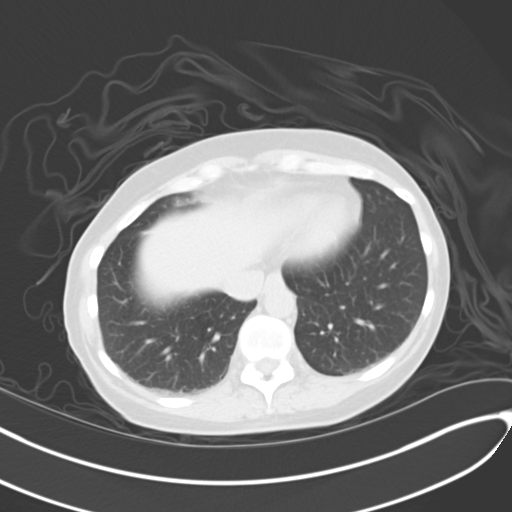
[im 91/95  soft-tissue]
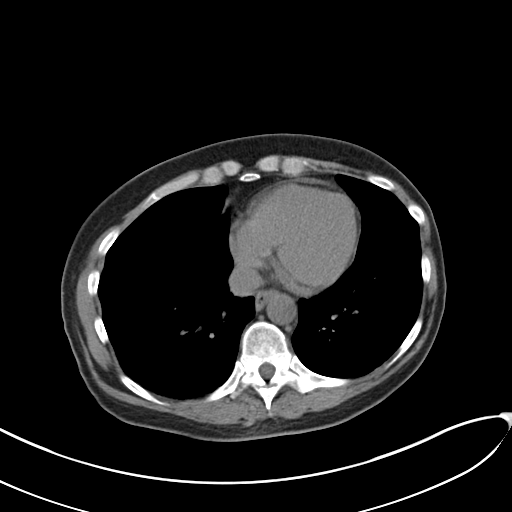
[im 91/95  lung]
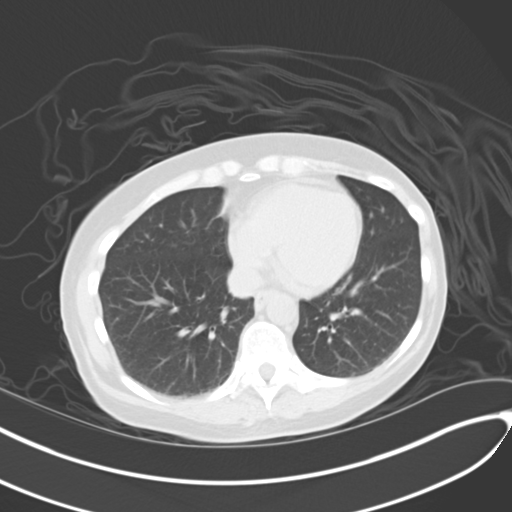

[15 of 32 positions shown; findings below may reference images not displayed]

FINDINGS: Minimal bibasilar atelectasis is noted.

A 0.9 cm nonspecific hypodensity is noted at the right hepatic lobe;
this could reflect a small cyst. The liver and spleen are otherwise
unremarkable. The gallbladder is within normal limits. The pancreas
and adrenal glands are unremarkable.

There is minimal left-sided perinephric stranding, and mild
thickening of the wall of the left renal pelvis. This could reflect
left-sided pyelonephritis, given the patient's symptoms. The right
kidney is grossly unremarkable in appearance. There is no evidence
of hydronephrosis. No renal or ureteral stones are seen.

No free fluid is identified. The small bowel is unremarkable in
appearance. The stomach is within normal limits. No acute vascular
abnormalities are seen. Mild scattered calcification is seen along
the abdominal aorta and its branches.

The appendix is normal in caliber and contains contrast and air,
without evidence for appendicitis. Contrast progresses to the level
of the transverse colon. The colon is partially filled with fluid
and air, and is unremarkable in appearance.

The bladder is mildly distended and grossly unremarkable. The
patient is status post hysterectomy. The ovaries are relatively
symmetric. No suspicious adnexal masses are identified. No inguinal
lymphadenopathy is seen.

No acute osseous abnormalities are identified.
IMPRESSION: 1. Minimal left-sided perinephric stranding, and mild thickening of
the wall of the left renal pelvis, could reflect mild left-sided
pyelonephritis, given the patient's symptoms.
2. Question of tiny hepatic cyst.
3. Mild scattered calcification along the abdominal aorta and its
branches.

## 2015-11-01 DIAGNOSIS — G43709 Chronic migraine without aura, not intractable, without status migrainosus: Secondary | ICD-10-CM | POA: Diagnosis not present

## 2015-11-01 DIAGNOSIS — M797 Fibromyalgia: Secondary | ICD-10-CM | POA: Diagnosis not present

## 2015-11-30 DIAGNOSIS — M797 Fibromyalgia: Secondary | ICD-10-CM | POA: Diagnosis not present

## 2015-11-30 DIAGNOSIS — R768 Other specified abnormal immunological findings in serum: Secondary | ICD-10-CM | POA: Diagnosis not present

## 2015-11-30 DIAGNOSIS — G8929 Other chronic pain: Secondary | ICD-10-CM | POA: Diagnosis not present

## 2015-11-30 DIAGNOSIS — B1920 Unspecified viral hepatitis C without hepatic coma: Secondary | ICD-10-CM | POA: Diagnosis not present

## 2015-11-30 DIAGNOSIS — E049 Nontoxic goiter, unspecified: Secondary | ICD-10-CM | POA: Diagnosis not present

## 2015-11-30 DIAGNOSIS — Z0000 Encounter for general adult medical examination without abnormal findings: Secondary | ICD-10-CM | POA: Diagnosis not present

## 2015-11-30 DIAGNOSIS — R69 Illness, unspecified: Secondary | ICD-10-CM | POA: Diagnosis not present

## 2015-12-05 ENCOUNTER — Other Ambulatory Visit: Payer: Self-pay | Admitting: Family Medicine

## 2015-12-05 DIAGNOSIS — E049 Nontoxic goiter, unspecified: Secondary | ICD-10-CM

## 2015-12-19 ENCOUNTER — Other Ambulatory Visit: Payer: Self-pay

## 2015-12-19 DIAGNOSIS — Z1231 Encounter for screening mammogram for malignant neoplasm of breast: Secondary | ICD-10-CM

## 2015-12-21 ENCOUNTER — Ambulatory Visit
Admission: RE | Admit: 2015-12-21 | Discharge: 2015-12-21 | Disposition: A | Discharge status: Home / Self Care | Payer: Medicare HMO | Source: Ambulatory Visit

## 2015-12-21 ENCOUNTER — Ambulatory Visit
Admission: RE | Admit: 2015-12-21 | Discharge: 2015-12-21 | Disposition: A | Discharge status: Home / Self Care | Payer: Medicare HMO | Source: Ambulatory Visit | Attending: Family Medicine | Admitting: Family Medicine

## 2015-12-21 DIAGNOSIS — E049 Nontoxic goiter, unspecified: Secondary | ICD-10-CM | POA: Diagnosis not present

## 2015-12-21 DIAGNOSIS — Z1231 Encounter for screening mammogram for malignant neoplasm of breast: Secondary | ICD-10-CM

## 2016-01-02 ENCOUNTER — Other Ambulatory Visit: Payer: Self-pay | Admitting: Family Medicine

## 2016-01-02 DIAGNOSIS — R928 Other abnormal and inconclusive findings on diagnostic imaging of breast: Secondary | ICD-10-CM

## 2016-01-09 ENCOUNTER — Ambulatory Visit
Admission: RE | Admit: 2016-01-09 | Discharge: 2016-01-09 | Disposition: A | Discharge status: Home / Self Care | Payer: Medicare HMO | Source: Ambulatory Visit | Attending: Family Medicine | Admitting: Family Medicine

## 2016-01-09 DIAGNOSIS — R928 Other abnormal and inconclusive findings on diagnostic imaging of breast: Secondary | ICD-10-CM

## 2016-01-10 DIAGNOSIS — G43719 Chronic migraine without aura, intractable, without status migrainosus: Secondary | ICD-10-CM | POA: Diagnosis not present

## 2016-01-10 DIAGNOSIS — M797 Fibromyalgia: Secondary | ICD-10-CM | POA: Diagnosis not present

## 2016-02-05 DIAGNOSIS — R300 Dysuria: Secondary | ICD-10-CM | POA: Diagnosis not present

## 2016-02-05 DIAGNOSIS — R319 Hematuria, unspecified: Secondary | ICD-10-CM | POA: Diagnosis not present

## 2016-02-05 DIAGNOSIS — N390 Urinary tract infection, site not specified: Secondary | ICD-10-CM | POA: Diagnosis not present

## 2016-02-28 DIAGNOSIS — G43709 Chronic migraine without aura, not intractable, without status migrainosus: Secondary | ICD-10-CM | POA: Diagnosis not present

## 2016-02-28 DIAGNOSIS — M797 Fibromyalgia: Secondary | ICD-10-CM | POA: Diagnosis not present

## 2016-02-29 DIAGNOSIS — B1920 Unspecified viral hepatitis C without hepatic coma: Secondary | ICD-10-CM | POA: Diagnosis not present

## 2016-02-29 DIAGNOSIS — E049 Nontoxic goiter, unspecified: Secondary | ICD-10-CM | POA: Diagnosis not present

## 2016-02-29 DIAGNOSIS — R309 Painful micturition, unspecified: Secondary | ICD-10-CM | POA: Diagnosis not present

## 2016-02-29 DIAGNOSIS — R768 Other specified abnormal immunological findings in serum: Secondary | ICD-10-CM | POA: Diagnosis not present

## 2016-03-23 IMAGING — US US THYROID
1 series · 13 of 25 positions shown · non-contrast
Comparison: None.

CLINICAL DATA: Thyroid goiter

EXAM:
THYROID ULTRASOUND
TECHNIQUE: Ultrasound examination of the thyroid gland and adjacent soft
tissues was performed.

[Series 1: us thyroid · 0.07mm/px · 13 of 49 slices shown]
[im 1/49]
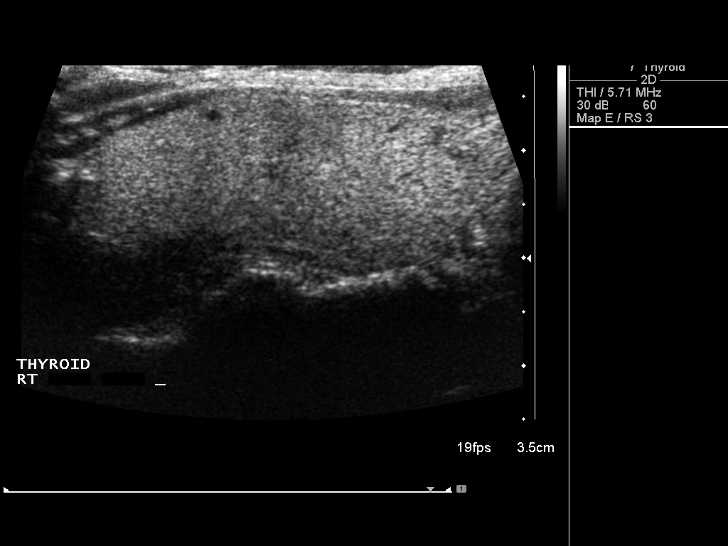
[im 5/49]
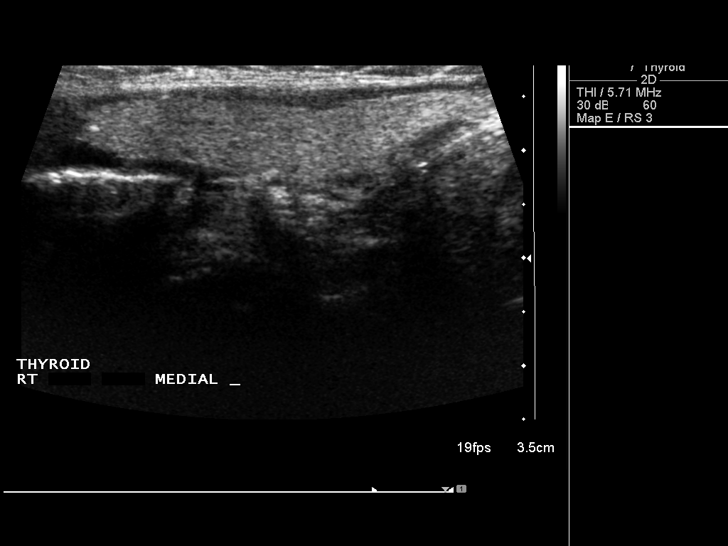
[im 9/49]
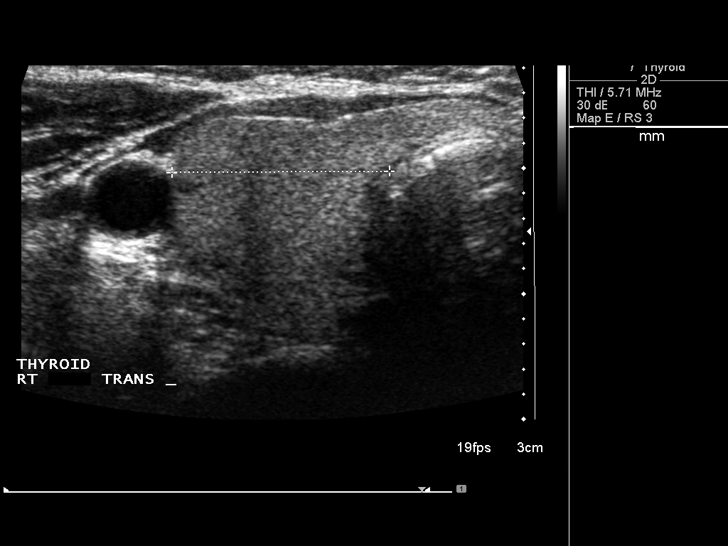
[im 13/49]
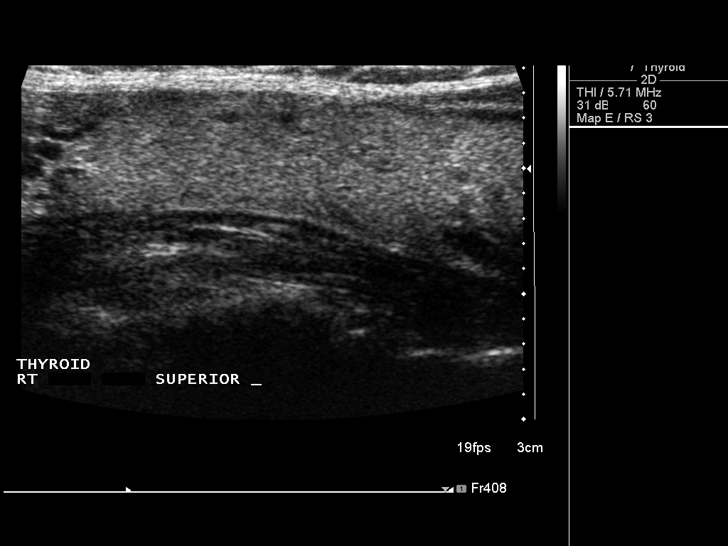
[im 17/49]
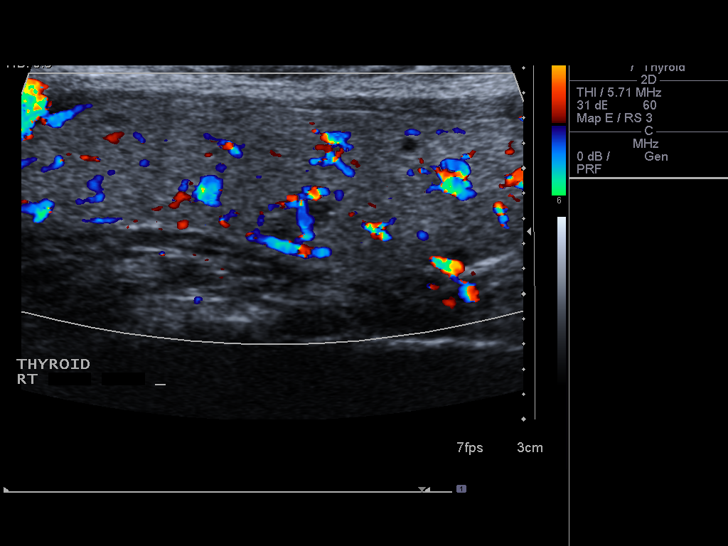
[im 21/49]
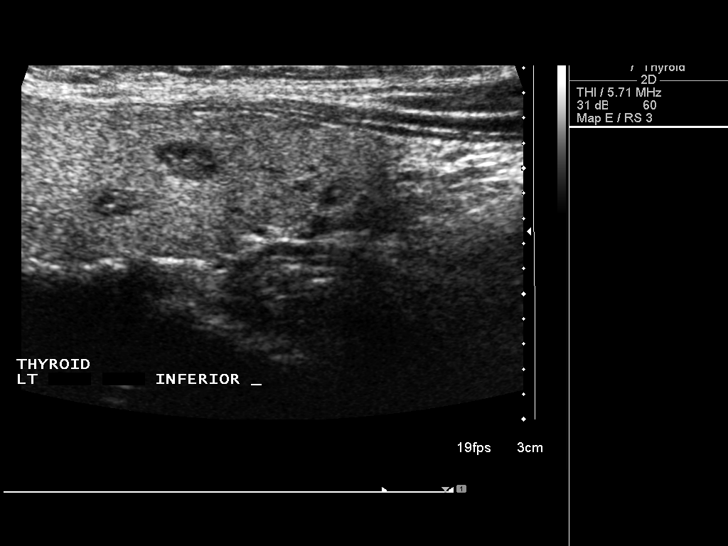
[im 25/49]
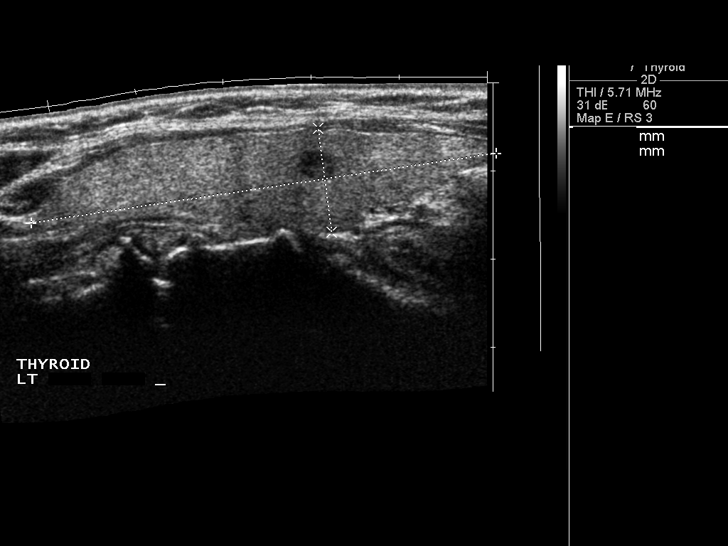
[im 29/49]
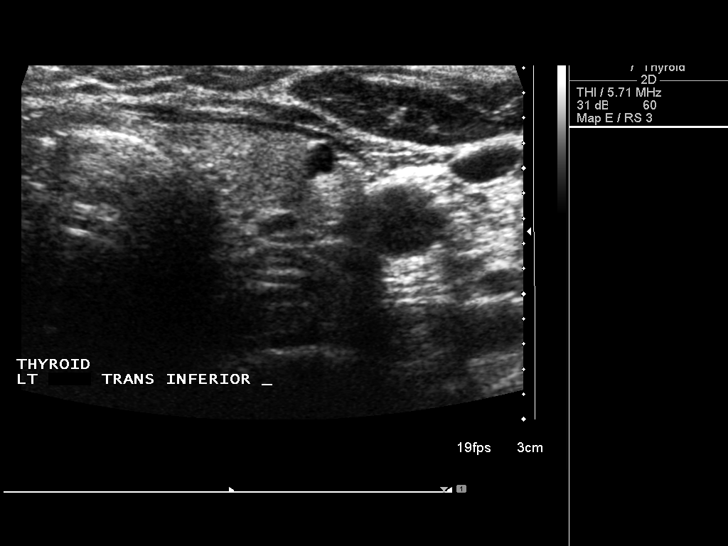
[im 33/49]
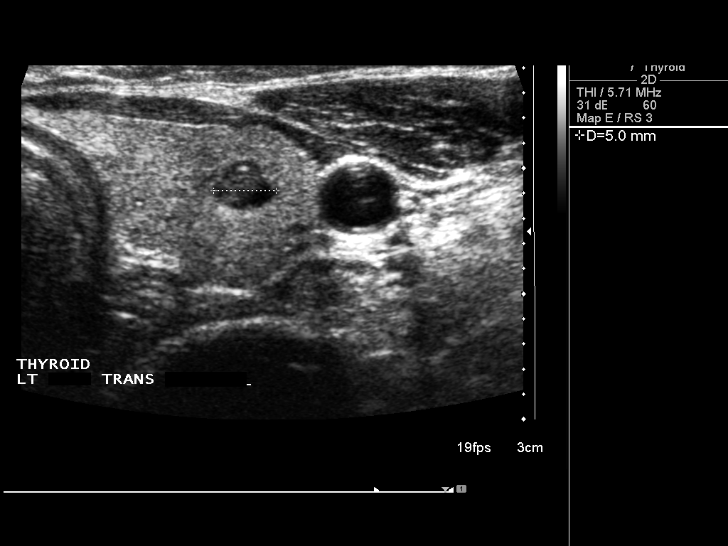
[im 37/49]
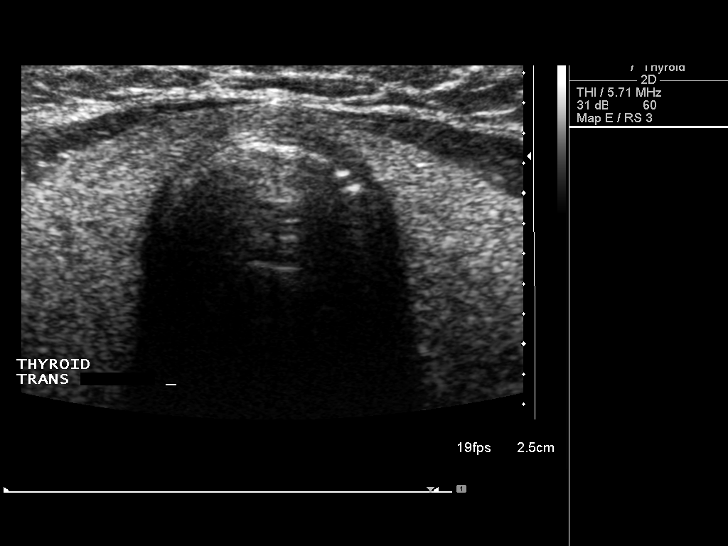
[im 41/49]
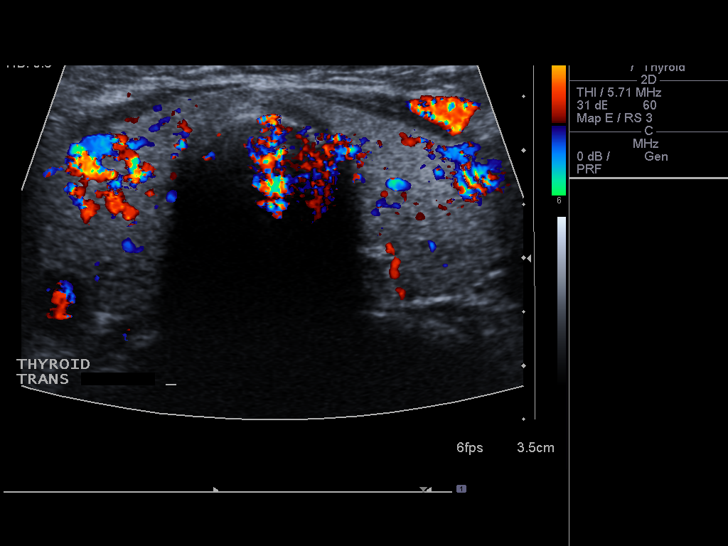
[im 45/49]
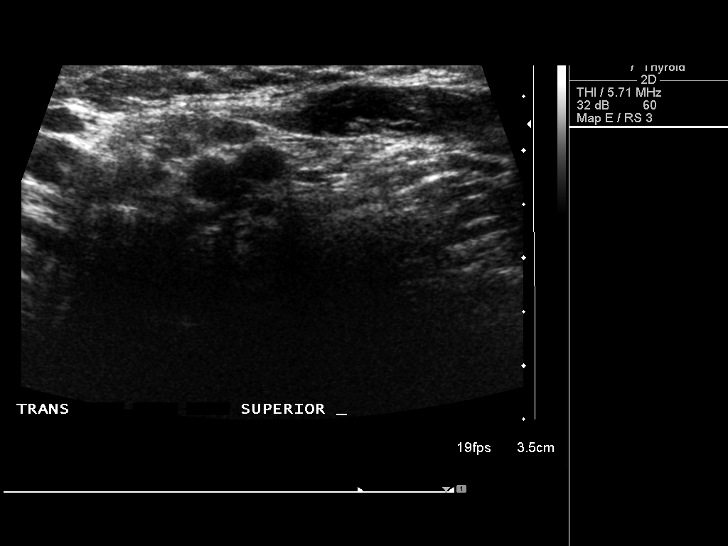
[im 49/49]
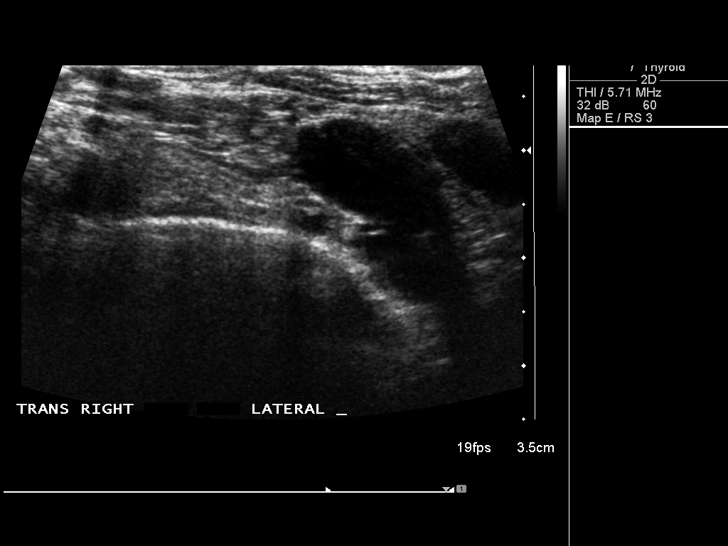

[13 of 25 positions shown; findings below may reference images not displayed]

FINDINGS: There is mild diffuse slightly coarsened heterogeneity of thyroid
parenchymal echotexture.

Right thyroid lobe

Measurements: Normal in size measuring 5.0 x 1.4 x 1.7 cm.

Right, mid/inferior, posterior - 0.3 cm - anechoic, cystic

Left thyroid lobe

Measurements: Normal in size measuring 5.3 x 1.2 x 1.7 cm

Left, mid / inferior - 0.6 x 0.4 x 0.5 cm - anechoic with eccentric
echogenic nodule with ring down artifact suggestive of colloid.

There is an additional punctate (approximately 4 mm) anechoic nodule
within the inferior aspect of the left lobe of the thyroid.

Isthmus

Thickness: Normal in size measures 0.2 cm in diameter

No discrete nodule or mass is identified within the thyroid
isthmus..

Lymphadenopathy

None visualized.
IMPRESSION: Findings suggestive of multi nodular goiter. None of the discretely
measured punctate (sub 6 mm) nodules currently meet imaging criteria
to recommend percutaneous sampling.

This recommendation follows the consensus statement: Management of
Thyroid Nodules Detected at US: Society of Radiologists in

## 2016-04-29 DIAGNOSIS — M797 Fibromyalgia: Secondary | ICD-10-CM | POA: Diagnosis not present

## 2016-04-29 DIAGNOSIS — G43719 Chronic migraine without aura, intractable, without status migrainosus: Secondary | ICD-10-CM | POA: Diagnosis not present

## 2016-05-02 DIAGNOSIS — H524 Presbyopia: Secondary | ICD-10-CM | POA: Diagnosis not present

## 2016-05-02 DIAGNOSIS — Z0100 Encounter for examination of eyes and vision without abnormal findings: Secondary | ICD-10-CM | POA: Diagnosis not present

## 2016-06-27 DIAGNOSIS — G43719 Chronic migraine without aura, intractable, without status migrainosus: Secondary | ICD-10-CM | POA: Diagnosis not present

## 2016-06-27 DIAGNOSIS — M797 Fibromyalgia: Secondary | ICD-10-CM | POA: Diagnosis not present

## 2016-09-12 DIAGNOSIS — M797 Fibromyalgia: Secondary | ICD-10-CM | POA: Diagnosis not present

## 2016-09-12 DIAGNOSIS — G43709 Chronic migraine without aura, not intractable, without status migrainosus: Secondary | ICD-10-CM | POA: Diagnosis not present

## 2016-11-25 DIAGNOSIS — R69 Illness, unspecified: Secondary | ICD-10-CM | POA: Diagnosis not present

## 2016-11-27 DIAGNOSIS — D2271 Melanocytic nevi of right lower limb, including hip: Secondary | ICD-10-CM | POA: Diagnosis not present

## 2016-11-27 DIAGNOSIS — D225 Melanocytic nevi of trunk: Secondary | ICD-10-CM | POA: Diagnosis not present

## 2016-11-27 DIAGNOSIS — L570 Actinic keratosis: Secondary | ICD-10-CM | POA: Diagnosis not present

## 2016-11-27 DIAGNOSIS — D2272 Melanocytic nevi of left lower limb, including hip: Secondary | ICD-10-CM | POA: Diagnosis not present

## 2016-11-27 DIAGNOSIS — L814 Other melanin hyperpigmentation: Secondary | ICD-10-CM | POA: Diagnosis not present

## 2016-11-27 DIAGNOSIS — L821 Other seborrheic keratosis: Secondary | ICD-10-CM | POA: Diagnosis not present

## 2016-11-27 DIAGNOSIS — D485 Neoplasm of uncertain behavior of skin: Secondary | ICD-10-CM | POA: Diagnosis not present

## 2016-12-04 ENCOUNTER — Other Ambulatory Visit: Payer: Self-pay | Admitting: Family Medicine

## 2016-12-04 DIAGNOSIS — E049 Nontoxic goiter, unspecified: Secondary | ICD-10-CM

## 2016-12-24 ENCOUNTER — Other Ambulatory Visit: Payer: Medicare HMO

## 2017-06-13 DIAGNOSIS — R69 Illness, unspecified: Secondary | ICD-10-CM | POA: Diagnosis not present

## 2017-06-25 DIAGNOSIS — Z1211 Encounter for screening for malignant neoplasm of colon: Secondary | ICD-10-CM | POA: Diagnosis not present

## 2017-06-25 DIAGNOSIS — E049 Nontoxic goiter, unspecified: Secondary | ICD-10-CM | POA: Diagnosis not present

## 2017-06-25 DIAGNOSIS — B1920 Unspecified viral hepatitis C without hepatic coma: Secondary | ICD-10-CM | POA: Diagnosis not present

## 2017-06-25 DIAGNOSIS — Z0000 Encounter for general adult medical examination without abnormal findings: Secondary | ICD-10-CM | POA: Diagnosis not present

## 2017-06-25 DIAGNOSIS — E785 Hyperlipidemia, unspecified: Secondary | ICD-10-CM | POA: Diagnosis not present

## 2017-06-25 DIAGNOSIS — R5383 Other fatigue: Secondary | ICD-10-CM | POA: Diagnosis not present

## 2017-08-07 DIAGNOSIS — J329 Chronic sinusitis, unspecified: Secondary | ICD-10-CM | POA: Diagnosis not present

## 2017-12-15 DIAGNOSIS — R69 Illness, unspecified: Secondary | ICD-10-CM | POA: Diagnosis not present

## 2017-12-16 DIAGNOSIS — K649 Unspecified hemorrhoids: Secondary | ICD-10-CM | POA: Diagnosis not present

## 2017-12-16 DIAGNOSIS — J329 Chronic sinusitis, unspecified: Secondary | ICD-10-CM | POA: Diagnosis not present

## 2018-05-21 DIAGNOSIS — H10029 Other mucopurulent conjunctivitis, unspecified eye: Secondary | ICD-10-CM | POA: Diagnosis not present

## 2018-06-02 DIAGNOSIS — T148XXA Other injury of unspecified body region, initial encounter: Secondary | ICD-10-CM | POA: Diagnosis not present

## 2018-06-02 DIAGNOSIS — H01009 Unspecified blepharitis unspecified eye, unspecified eyelid: Secondary | ICD-10-CM | POA: Diagnosis not present

## 2018-06-02 DIAGNOSIS — N3000 Acute cystitis without hematuria: Secondary | ICD-10-CM | POA: Diagnosis not present

## 2018-06-23 DIAGNOSIS — F4310 Post-traumatic stress disorder, unspecified: Secondary | ICD-10-CM | POA: Insufficient documentation

## 2018-07-09 ENCOUNTER — Ambulatory Visit: Payer: Medicare HMO | Admitting: Psychiatry

## 2018-07-09 DIAGNOSIS — F4001 Agoraphobia with panic disorder: Secondary | ICD-10-CM | POA: Diagnosis not present

## 2018-07-09 DIAGNOSIS — F3342 Major depressive disorder, recurrent, in full remission: Secondary | ICD-10-CM | POA: Diagnosis not present

## 2018-07-09 DIAGNOSIS — R69 Illness, unspecified: Secondary | ICD-10-CM | POA: Diagnosis not present

## 2018-07-09 DIAGNOSIS — F4310 Post-traumatic stress disorder, unspecified: Secondary | ICD-10-CM | POA: Diagnosis not present

## 2018-07-09 MED ORDER — SERTRALINE HCL 100 MG PO TABS: 200.0000 mg | ORAL_TABLET | Freq: Every day | ORAL | 2 refills | Status: DC

## 2018-07-09 NOTE — Progress Notes (Signed)
[facility]  Patient ID: Shelley [name],  MRN: [id number]  PCP: Lawerance Cruel, MD  Date of Evaluation: 07/09/2018 Time spent:30 minutes   HISTORY/CURRENT STATUS: HPI Anx is chronic and flares into panic under stress but usually manageable.   Little avoidance but doesn't get out as much as she used to.  More pain off ms contin.  Limits activity.  Enjoys gkids.Not  Much depression. Sleep ok. Satisfied with meds.  Individual Medical History/ Review of Systems: Changes? :Yes Stopped opiates.  Allergies: Penicillins; Lyrica [pregabalin]; Codeine; and Tramadol  Current Medications:  Current Outpatient Medications:  .  ciprofloxacin (CIPRO) 500 MG tablet, Take 1 tablet (500 mg total) by mouth 2 (two) times daily., Disp: 10 tablet, Rfl: 0 .  diazepam (VALIUM) 10 MG tablet, Take 10 mg by mouth 4 (four) times daily., Disp: , Rfl:  .  sertraline (ZOLOFT) 100 MG tablet, Take 2 tablets (200 mg total) by mouth daily., Disp: 180 tablet, Rfl: 2 .  promethazine (PHENERGAN) 25 MG tablet, Take 25 mg by mouth as needed (migrane)., Disp: , Rfl:  Medication Side Effects: None  Family Medical/ Social History: Changes? Yes Alcoholic exH with health px moved in with her.  Stressful.  4 gkids: 20 to 1you  MENTAL HEALTH EXAM:  There were no vitals taken for this visit.There is no height or weight on file to calculate BMI.  General Appearance: Neat  Eye Contact:  Good  Speech:  Normal Rate  Volume:  Normal  Mood:  Anxious  Affect:  Congruent  Thought Process:  Goal Directed  Orientation:  Full (Time, Place, and Person)  Thought Content: WDL   Suicidal Thoughts:  No  Homicidal Thoughts:  No  Memory:  Recent  Judgement:  Good  Insight:  Good  Psychomotor Activity:  Normal  Concentration:  Concentration: Good  Recall:  Good  Fund of Knowledge: Good  Language: Good  Akathisia:  No  AIMS (if indicated): not done  Assets:  Communication Skills Housing Intimacy Resilience   ADL's:  Intact  Cognition: WNL  Prognosis:  Fair    DIAGNOSES:    ICD-10-CM   1. Panic disorder with agoraphobia F40.01   2. PTSD (post-traumatic stress disorder) F43.10   3. Recurrent major depressive disorder, in full remission ([place]) F33.42     RECOMMENDATIONS:  Congratulated her re getting off opiates. Needs SSRI LT for panic and anxiety.  Benefits. High relaps risk without this Cannot stop the valium.   She's tried and gets panic attacks.  Disc LT risk BZ.    Purnell Shoemaker, MD

## 2018-08-24 ENCOUNTER — Telehealth: Payer: Self-pay | Admitting: Psychiatry

## 2018-08-24 ENCOUNTER — Other Ambulatory Visit: Payer: Self-pay

## 2018-08-24 MED ORDER — DIAZEPAM 10 MG PO TABS: 10.0000 mg | ORAL_TABLET | Freq: Four times a day (QID) | ORAL | 1 refills | Status: DC

## 2018-08-24 NOTE — Telephone Encounter (Signed)
Pt called and says that she needs a refill on her valium.[facility]

## 2018-08-24 NOTE — Telephone Encounter (Signed)
Already refilled

## 2018-09-15 DIAGNOSIS — H00016 Hordeolum externum left eye, unspecified eyelid: Secondary | ICD-10-CM | POA: Diagnosis not present

## 2018-09-15 DIAGNOSIS — J441 Chronic obstructive pulmonary disease with (acute) exacerbation: Secondary | ICD-10-CM | POA: Diagnosis not present

## 2018-10-22 ENCOUNTER — Other Ambulatory Visit: Payer: Self-pay | Admitting: Psychiatry

## 2018-11-30 DIAGNOSIS — N390 Urinary tract infection, site not specified: Secondary | ICD-10-CM | POA: Diagnosis not present

## 2018-11-30 DIAGNOSIS — R3911 Hesitancy of micturition: Secondary | ICD-10-CM | POA: Diagnosis not present

## 2018-12-15 ENCOUNTER — Telehealth: Payer: Self-pay | Admitting: Psychiatry

## 2018-12-15 ENCOUNTER — Other Ambulatory Visit: Payer: Self-pay

## 2018-12-15 MED ORDER — DIAZEPAM 10 MG PO TABS: 10.0000 mg | ORAL_TABLET | Freq: Four times a day (QID) | ORAL | 1 refills | Status: DC

## 2018-12-15 NOTE — Telephone Encounter (Signed)
Patient need refill on [place] filled on 03/25 to be sent to [facility] [address].

## 2018-12-15 NOTE — Telephone Encounter (Signed)
Submitted to pharmacy per request

## 2018-12-31 ENCOUNTER — Ambulatory Visit: Payer: Self-pay | Admitting: Family Medicine

## 2019-02-18 ENCOUNTER — Other Ambulatory Visit: Payer: Self-pay | Admitting: Psychiatry

## 2019-02-18 NOTE — Telephone Encounter (Signed)
Pt called to request refill on diazepam @ [facility]

## 2019-03-15 ENCOUNTER — Other Ambulatory Visit: Payer: Self-pay

## 2019-03-15 ENCOUNTER — Ambulatory Visit: Payer: Medicare HMO | Admitting: Psychiatry

## 2019-03-15 ENCOUNTER — Encounter: Payer: Self-pay | Admitting: Psychiatry

## 2019-03-15 DIAGNOSIS — F4001 Agoraphobia with panic disorder: Secondary | ICD-10-CM | POA: Diagnosis not present

## 2019-03-15 DIAGNOSIS — F4310 Post-traumatic stress disorder, unspecified: Secondary | ICD-10-CM

## 2019-03-15 DIAGNOSIS — F3342 Major depressive disorder, recurrent, in full remission: Secondary | ICD-10-CM

## 2019-03-15 MED ORDER — SERTRALINE HCL 100 MG PO TABS: 200.0000 mg | ORAL_TABLET | Freq: Every day | ORAL | 3 refills | Status: DC

## 2019-03-15 MED ORDER — DIAZEPAM 10 MG PO TABS: 10.0000 mg | ORAL_TABLET | Freq: Four times a day (QID) | ORAL | 5 refills | Status: DC

## 2019-03-15 NOTE — Progress Notes (Signed)
Shelley [name] [id number] May 25, 1956 63 y.o.  Subjective:   Patient ID:  Shelley [name] is a 63 y.o. (DOB Mar 18, 1956) female.  Chief Complaint:  Chief Complaint  Patient presents with  . Follow-up    Medication Management  . Depression    Medication Management  . Other    PTSD    HPI Shelley [name] presents to the office today for follow-up of panic disorder with agoraphobia, PTSD, and recurrent major depression.  Last seen in October and no meds were changed.    Quit smoking and gained 20#.  Still Working.  Manages anxiety but does have triggers for anxiety.  Patient reports stable mood and denies depressed or irritable moods.  Patient denies any recent difficulty with anxiety.  Patient denies difficulty with sleep initiation or maintenance usually unless worry on her mind. Denies appetite disturbance.  Patient reports that energy and motivation have been good.  Patient denies any difficulty with concentration.  Patient denies any suicidal ideation.    Has a new grandson.  GD had 80 yo birthday.  [name]  71 mos old.  [name] is 65 years old.    Past Psychiatric Medication Trials: Trazodone, sertraline, duloxetine side effects, gabapentin irritability, Lyrica, Ambien side effects, diazepam, clonidine, primidone, Xanax, clonazepam, Wellbutrin jumpy, Lexapro Lexapro headache, paroxetine nausea, Effexor nausea  Review of Systems:  Review of Systems  Musculoskeletal: Positive for back pain.  Neurological: Positive for headaches. Negative for tremors and weakness.    Medications: I have reviewed the patient's current medications.  Current Outpatient Medications  Medication Sig Dispense Refill  . Coenzyme Q10 (CO Q 10 PO) Take by mouth.    . diazepam (VALIUM) 10 MG tablet Take 1 tablet (10 mg total) by mouth 4 (four) times daily. 120 tablet 5  . sertraline (ZOLOFT) 100 MG tablet Take 2 tablets (200 mg total) by mouth daily. 180 tablet 3   No current facility-administered medications for  this visit.     Medication Side Effects: None  Allergies:  Allergies  Allergen Reactions  . Penicillins Anaphylaxis  . Lyrica [Pregabalin] Other (See Comments)    Walks into walls with this med  . Codeine Rash  . Tramadol Nausea And Vomiting and Rash    Past Medical History:  Diagnosis Date  . Anxiety   . Chronic migraine   . Depression   . Fibromyalgia   . Hepatitis C   . IBS (irritable bowel syndrome)     Family History  Problem Relation Age of Onset  . Cancer Mother   . Stroke Sister   . Hypertension Sister   . Hypertension Brother   . Diabetes Brother     Social History   Socioeconomic History  . Marital status: Divorced    Spouse name: Not on file  . Number of children: Not on file  . Years of education: Not on file  . Highest education level: Not on file  Occupational History    Comment: disability, 2010  Social Needs  . Financial resource strain: Not on file  . Food insecurity    Worry: Not on file    Inability: Not on file  . Transportation needs    Medical: Not on file    Non-medical: Not on file  Tobacco Use  . Smoking status: Former Smoker    Packs/day: 1.00    Years: 20.00    Pack years: 20.00  . Smokeless tobacco: Never Used  . Tobacco comment: uses smoke stick, just switched to half pack  a day 7.10.14  Substance and Sexual Activity  . Alcohol use: No  . Drug use: No  . Sexual activity: Not Currently  Lifestyle  . Physical activity    Days per week: Not on file    Minutes per session: Not on file  . Stress: Not on file  Relationships  . Social Herbalist on phone: Not on file    Gets together: Not on file    Attends religious service: Not on file    Active member of club or organization: Not on file    Attends meetings of clubs or organizations: Not on file    Relationship status: Not on file  . Intimate partner violence    Fear of current or ex partner: Not on file    Emotionally abused: Not on file    Physically  abused: Not on file    Forced sexual activity: Not on file  Other Topics Concern  . Not on file  Social History Narrative   Lives with son, Shelley [name]    Past Medical History, Surgical history, Social history, and Family history were reviewed and updated as appropriate.   Please see review of systems for further details on the patient's review from today.   Objective:   Physical Exam:  There were no vitals taken for this visit.  Physical Exam Constitutional:      General: She is not in acute distress.    Appearance: She is well-developed.  Musculoskeletal:        General: No deformity.  Neurological:     Mental Status: She is alert and oriented to person, place, and time.     Coordination: Coordination normal.  Psychiatric:        Attention and Perception: Attention normal. She is attentive.        Mood and Affect: Mood is anxious. Mood is not depressed. Affect is not labile, blunt, angry or inappropriate.        Speech: Speech normal.        Behavior: Behavior normal.        Thought Content: Thought content normal. Thought content is not paranoid. Thought content does not include homicidal or suicidal ideation. Thought content does not include homicidal or suicidal plan.        Cognition and Memory: Cognition normal.        Judgment: Judgment normal.     Comments: Insight is good.     Lab Review:     Component Value Date/Time   NA 138 07/07/2014 0455   K 3.5 (L) 07/07/2014 0455   CL 103 07/07/2014 0455   CO2 26 07/07/2014 0455   GLUCOSE 112 (H) 07/07/2014 0455   BUN 5 (L) 07/07/2014 0455   CREATININE 0.61 07/07/2014 0455   CALCIUM 7.9 (L) 07/07/2014 0455   PROT 7.8 07/05/2014 1853   ALBUMIN 3.6 07/05/2014 1853   AST 22 07/05/2014 1853   ALT 10 07/05/2014 1853   ALKPHOS 98 07/05/2014 1853   BILITOT 0.6 07/05/2014 1853   GFRNONAA >90 07/07/2014 0455   GFRAA >90 07/07/2014 0455       Component Value Date/Time   WBC 5.9 07/08/2014 0445   RBC 4.01 07/08/2014 0445    HGB 12.5 07/08/2014 0445   HCT 37.2 07/08/2014 0445   PLT 121 (L) 07/08/2014 0445   MCV 92.8 07/08/2014 0445   MCV 100.1 (A) 04/12/2013 1516   MCH 31.2 07/08/2014 0445   MCHC 33.6 07/08/2014 0445   RDW  13.1 07/08/2014 0445   LYMPHSABS 1.4 07/05/2014 2028   MONOABS 1.1 (H) 07/05/2014 2028   EOSABS 0.0 07/05/2014 2028   BASOSABS 0.0 07/05/2014 2028    No results found for: POCLITH, LITHIUM   No results found for: PHENYTOIN, PHENOBARB, VALPROATE, CBMZ   .res Assessment: Plan:    Michaelyn was seen today for follow-up, depression and other.  Diagnoses and all orders for this visit:  Panic disorder with agoraphobia -     diazepam (VALIUM) 10 MG tablet; Take 1 tablet (10 mg total) by mouth 4 (four) times daily. -     sertraline (ZOLOFT) 100 MG tablet; Take 2 tablets (200 mg total) by mouth daily.  PTSD (post-traumatic stress disorder) -     sertraline (ZOLOFT) 100 MG tablet; Take 2 tablets (200 mg total) by mouth daily.  Recurrent major depressive disorder, in full remission ([place]) -     sertraline (ZOLOFT) 100 MG tablet; Take 2 tablets (200 mg total) by mouth daily.  Cont current meds, continue Diazepam 10 QID, Sertraline 200.  Satisfied.  Failed multiple others as noted above.    Supportive therapy dealing with triggers for anxiety and PTSD.  Stay active.  Has flowers.  We discussed the short-term risks associated with benzodiazepines including sedation and increased fall risk among others.  Discussed long-term side effect risk including dependence, potential withdrawal symptoms, and the potential eventual dose-related risk of dementia.  FU 6 mos  Lynder Parents, MD, DFAPA   Please see After Visit Summary for patient specific instructions.  No future appointments.  No orders of the defined types were placed in this encounter.   -------------------------------

## 2019-04-08 ENCOUNTER — Ambulatory Visit: Payer: Medicare HMO | Admitting: Psychiatry

## 2019-09-13 ENCOUNTER — Encounter: Payer: Self-pay | Admitting: Psychiatry

## 2019-09-13 ENCOUNTER — Ambulatory Visit (INDEPENDENT_AMBULATORY_CARE_PROVIDER_SITE_OTHER): Payer: Medicare HMO | Admitting: Psychiatry

## 2019-09-13 DIAGNOSIS — F4310 Post-traumatic stress disorder, unspecified: Secondary | ICD-10-CM | POA: Diagnosis not present

## 2019-09-13 DIAGNOSIS — F3342 Major depressive disorder, recurrent, in full remission: Secondary | ICD-10-CM

## 2019-09-13 DIAGNOSIS — F4001 Agoraphobia with panic disorder: Secondary | ICD-10-CM | POA: Diagnosis not present

## 2019-09-13 MED ORDER — DIAZEPAM 10 MG PO TABS: 10.0000 mg | ORAL_TABLET | Freq: Four times a day (QID) | ORAL | 5 refills | Status: DC

## 2019-09-13 MED ORDER — SERTRALINE HCL 100 MG PO TABS: 200.0000 mg | ORAL_TABLET | Freq: Every day | ORAL | 3 refills | Status: DC

## 2019-09-13 NOTE — Progress Notes (Signed)
Shelley [name] [id number] Aug 29, 1956 63 y.o.  Subjective:   Patient ID:  Shelley [name] is a 63 y.o. (DOB March 05, 1956) female.  Virtual Visit via [place]  I connected with pt by WebEx and verified that I am speaking with the correct person using two identifiers.   I discussed the limitations, risks, security and privacy concerns of performing an evaluation and management service by Shelley [name] and the availability of in person appointments. I also discussed with the patient that there may be a patient responsible charge related to this service. The patient expressed understanding and agreed to proceed.  I discussed the assessment and treatment plan with the patient. The patient was provided an opportunity to ask questions and all were answered. The patient agreed with the plan and demonstrated an understanding of the instructions.   The patient was advised to call back or seek an in-person evaluation if the symptoms worsen or if the condition fails to improve as anticipated.  I provided 30 minutes of video time during this encounter. The call started at 100 and ended at 1:30. The patient was located at home and the provider was located office.   Chief Complaint:  Chief Complaint  Patient presents with  . Follow-up    Medication Management  . Depression    Medication Management  . Other    PTSD  . Anxiety    Depression        Associated symptoms include headaches.  Shelley [name] presents to the office today for follow-up of panic disorder with agoraphobia, PTSD, and recurrent major depression.  Last seen in March 15, 2019 and no meds were changed.  Continued on sertraline 200 mg daily and diazepam 10 mg 4 times daily for panic disorder.  Has stayed well medically.  Good support from family.  Quit smoking and gained 20#.  Still Working.  Manages anxiety but does have triggers for anxiety.  Patient reports stable mood and denies depressed or irritable moods except blue days..  Patient denies  any recent difficulty with anxiety.  Patient denies difficulty with sleep initiation or maintenance usually unless worry on her mind. Denies appetite disturbance.  Patient reports that energy and motivation have been good.  Patient denies any difficulty with concentration.  Patient denies any suicidal ideation.    Has a new grandson April 30, 2019.  GD had 43 yo birthday.  Shelley [name]  16 mos old.  Shelley [name] is 63 years old.    Past Psychiatric Medication Trials: Trazodone, sertraline, duloxetine side effects, Wellbutrin jumpy, Lexapro headache, paroxetine nausea, Effexor nausea gabapentin irritability, Lyrica, Ambien side effects,  clonidine, primidone, Xanax, clonazepam,diazepam,  Review of Systems:  Review of Systems  Musculoskeletal: Positive for back pain.  Neurological: Positive for headaches. Negative for dizziness, tremors and weakness.  Psychiatric/Behavioral: Positive for depression.    Medications: I have reviewed the patient's current medications.  Current Outpatient Medications  Medication Sig Dispense Refill  . Coenzyme Q10 (CO Q 10 PO) Take by mouth.    . diazepam (VALIUM) 10 MG tablet Take 1 tablet (10 mg total) by mouth 4 (four) times daily. 120 tablet 5  . sertraline (ZOLOFT) 100 MG tablet Take 2 tablets (200 mg total) by mouth daily. 180 tablet 3   No current facility-administered medications for this visit.    Medication Side Effects: None  Allergies:  Allergies  Allergen Reactions  . Penicillins Anaphylaxis  . Lyrica [Pregabalin] Other (See Comments)    Walks into walls with this med  . Codeine Rash  .  Tramadol Nausea And Vomiting and Rash    Past Medical History:  Diagnosis Date  . Anxiety   . Chronic migraine   . Depression   . Fibromyalgia   . Hepatitis C   . IBS (irritable bowel syndrome)     Family History  Problem Relation Age of Onset  . Cancer Mother   . Stroke Sister   . Hypertension Sister   . Hypertension Brother   . Diabetes Brother      Social History   Socioeconomic History  . Marital status: Divorced    Spouse name: Not on file  . Number of children: Not on file  . Years of education: Not on file  . Highest education level: Not on file  Occupational History    Comment: disability, 2010  Tobacco Use  . Smoking status: Former Smoker    Packs/day: 1.00    Years: 20.00    Pack years: 20.00  . Smokeless tobacco: Never Used  . Tobacco comment: uses smoke stick, just switched to half pack a day 7.10.14  Substance and Sexual Activity  . Alcohol use: No  . Drug use: No  . Sexual activity: Not Currently  Other Topics Concern  . Not on file  Social History Narrative   Lives with son, Shelley [name]   Social Determinants of Health   Financial Resource Strain:   . Difficulty of Paying Living Expenses: Not on file  Food Insecurity:   . Worried About Charity fundraiser in the Last Year: Not on file  . Ran Out of Food in the Last Year: Not on file  Transportation Needs:   . Lack of Transportation (Medical): Not on file  . Lack of Transportation (Non-Medical): Not on file  Physical Activity:   . Days of Exercise per Week: Not on file  . Minutes of Exercise per Session: Not on file  Stress:   . Feeling of Stress : Not on file  Social Connections:   . Frequency of Communication with Friends and Family: Not on file  . Frequency of Social Gatherings with Friends and Family: Not on file  . Attends Religious Services: Not on file  . Active Member of Clubs or Organizations: Not on file  . Attends Archivist Meetings: Not on file  . Marital Status: Not on file  Intimate Partner Violence:   . Fear of Current or Ex-Partner: Not on file  . Emotionally Abused: Not on file  . Physically Abused: Not on file  . Sexually Abused: Not on file    Past Medical History, Surgical history, Social history, and Family history were reviewed and updated as appropriate.   Please see review of systems for further details on the  patient's review from today.   Objective:   Physical Exam:  There were no vitals taken for this visit.  Physical Exam Neurological:     Mental Status: She is alert and oriented to person, place, and time.     Cranial Nerves: No dysarthria.  Psychiatric:        Attention and Perception: Attention and perception normal.        Mood and Affect: Mood is anxious.        Speech: Speech normal.        Behavior: Behavior is cooperative.        Thought Content: Thought content normal. Thought content is not paranoid or delusional. Thought content does not include homicidal or suicidal ideation. Thought content does not include homicidal or  suicidal plan.        Cognition and Memory: Cognition and memory normal.        Judgment: Judgment normal.     Comments: Insight intact     Lab Review:     Component Value Date/Time   NA 138 07/07/2014 0455   K 3.5 (L) 07/07/2014 0455   CL 103 07/07/2014 0455   CO2 26 07/07/2014 0455   GLUCOSE 112 (H) 07/07/2014 0455   BUN 5 (L) 07/07/2014 0455   CREATININE 0.61 07/07/2014 0455   CALCIUM 7.9 (L) 07/07/2014 0455   PROT 7.8 07/05/2014 1853   ALBUMIN 3.6 07/05/2014 1853   AST 22 07/05/2014 1853   ALT 10 07/05/2014 1853   ALKPHOS 98 07/05/2014 1853   BILITOT 0.6 07/05/2014 1853   GFRNONAA >90 07/07/2014 0455   GFRAA >90 07/07/2014 0455       Component Value Date/Time   WBC 5.9 07/08/2014 0445   RBC 4.01 07/08/2014 0445   HGB 12.5 07/08/2014 0445   HCT 37.2 07/08/2014 0445   PLT 121 (L) 07/08/2014 0445   MCV 92.8 07/08/2014 0445   MCV 100.1 (A) 04/12/2013 1516   MCH 31.2 07/08/2014 0445   MCHC 33.6 07/08/2014 0445   RDW 13.1 07/08/2014 0445   LYMPHSABS 1.4 07/05/2014 2028   MONOABS 1.1 (H) 07/05/2014 2028   EOSABS 0.0 07/05/2014 2028   BASOSABS 0.0 07/05/2014 2028    No results found for: POCLITH, LITHIUM   No results found for: PHENYTOIN, PHENOBARB, VALPROATE, CBMZ   .res Assessment: Plan:    Flavia was seen today for  follow-up, depression, other and anxiety.  Diagnoses and all orders for this visit:  Panic disorder with agoraphobia  PTSD (post-traumatic stress disorder)  Recurrent major depressive disorder, in full remission ([place])     Cont current meds, continue Diazepam 10 QID, Sertraline 200.  Satisfied.  Failed multiple others as noted above.    Supportive therapy dealing with triggers for anxiety and PTSD.  Stay active.  Disc Covid fears.  We discussed the short-term risks associated with benzodiazepines including sedation and increased fall risk among others.  Discussed long-term side effect risk including dependence, potential withdrawal symptoms, and the potential eventual dose-related risk of dementia.  FU 6 mos   Lynder Parents, MD, DFAPA   Please see After Visit Summary for patient specific instructions.  No future appointments.  No orders of the defined types were placed in this encounter.   -------------------------------

## 2020-03-13 ENCOUNTER — Ambulatory Visit (INDEPENDENT_AMBULATORY_CARE_PROVIDER_SITE_OTHER): Payer: Medicare Other | Admitting: Psychiatry

## 2020-03-13 ENCOUNTER — Encounter: Payer: Self-pay | Admitting: Psychiatry

## 2020-03-13 ENCOUNTER — Other Ambulatory Visit: Payer: Self-pay

## 2020-03-13 DIAGNOSIS — F3342 Major depressive disorder, recurrent, in full remission: Secondary | ICD-10-CM | POA: Diagnosis not present

## 2020-03-13 DIAGNOSIS — F4310 Post-traumatic stress disorder, unspecified: Secondary | ICD-10-CM | POA: Diagnosis not present

## 2020-03-13 DIAGNOSIS — F4001 Agoraphobia with panic disorder: Secondary | ICD-10-CM | POA: Diagnosis not present

## 2020-03-13 MED ORDER — DIAZEPAM 10 MG PO TABS: 10.0000 mg | ORAL_TABLET | Freq: Four times a day (QID) | ORAL | 5 refills | Status: DC

## 2020-03-13 NOTE — Progress Notes (Signed)
Shelley [name] [id number] 01-13-1956 64 y.o.  Subjective:   Patient ID:  Shelley [name] is a 64 y.o. (DOB [birth year]) female.  Chief Complaint:  Chief Complaint  Patient presents with  . Follow-up  . Anxiety    Depression        Associated symptoms include headaches.  Shelley [name] presents to the office today for follow-up of panic disorder with agoraphobia, PTSD, and recurrent major depression.  seen in March 15, 2019 and December 2020 and no meds were changed.  Continued on sertraline 200 mg daily and diazepam 10 mg 4 times daily for panic disorder.  03/13/2020 appointment with the following noted: Stress of the pandemic.  Also some family issues which create stress.   Has some chronic family problems.    Has stayed well medically.  Good support from family.  Quit smoking and gained 20#.  Still Working.  Still get triggered panic attacks and needs the meds.  Patient reports stable mood and denies depressed or irritable moods except blue days.  Patient denies difficulty with sleep initiation or maintenance usually unless worry on her mind. Denies appetite disturbance.  Patient reports that energy and motivation have been good.  Patient denies any difficulty with concentration.  Patient denies any suicidal ideation.    Has a new grandson April 30, 2019.  GD Shelley [name] had 64 yo birthday.  Shelley [name]  64 yo old.  Shelley [name] is 64 years old.    Past Psychiatric Medication Trials: Trazodone, sertraline, duloxetine side effects, Wellbutrin jumpy, Lexapro headache, paroxetine nausea, Effexor nausea gabapentin irritability, Lyrica, Ambien side effects,  clonidine, primidone, Xanax, clonazepam,diazepam,  Review of Systems:  Review of Systems  Musculoskeletal: Positive for back pain.  Neurological: Positive for headaches. Negative for dizziness, tremors, weakness and light-headedness.  Psychiatric/Behavioral: Positive for depression.    Medications: I have reviewed the patient's current  medications.  Current Outpatient Medications  Medication Sig Dispense Refill  . Coenzyme Q10 (CO Q 10 PO) Take by mouth.    . diazepam (VALIUM) 10 MG tablet Take 1 tablet (10 mg total) by mouth 4 (four) times daily. 120 tablet 5  . sertraline (ZOLOFT) 100 MG tablet Take 2 tablets (200 mg total) by mouth daily. 180 tablet 3  . VITAMIN D, CHOLECALCIFEROL, PO Take by mouth.     No current facility-administered medications for this visit.    Medication Side Effects: None  Allergies:  Allergies  Allergen Reactions  . Penicillins Anaphylaxis  . Lyrica [Pregabalin] Other (See Comments)    Walks into walls with this med  . Codeine Rash  . Tramadol Nausea And Vomiting and Rash    Past Medical History:  Diagnosis Date  . Anxiety   . Chronic migraine   . Depression   . Fibromyalgia   . Hepatitis C   . IBS (irritable bowel syndrome)     Family History  Problem Relation Age of Onset  . Cancer Mother   . Stroke Sister   . Hypertension Sister   . Hypertension Brother   . Diabetes Brother     Social History   Socioeconomic History  . Marital status: Divorced    Spouse name: Not on file  . Number of children: Not on file  . Years of education: Not on file  . Highest education level: Not on file  Occupational History    Comment: disability, 2010  Tobacco Use  . Smoking status: Former Smoker    Packs/day: 1.00    Years: 20.00  Pack years: 20.00  . Smokeless tobacco: Never Used  . Tobacco comment: uses smoke stick, just switched to half pack a day 7.10.14  Substance and Sexual Activity  . Alcohol use: No  . Drug use: No  . Sexual activity: Not Currently  Other Topics Concern  . Not on file  Social History Narrative   Lives with son, Shelley [name]   Social Determinants of Health   Financial Resource Strain:   . Difficulty of Paying Living Expenses:   Food Insecurity:   . Worried About Charity fundraiser in the Last Year:   . Arboriculturist in the Last Year:    Transportation Needs:   . Film/video editor (Medical):   [name] Lack of Transportation (Non-Medical):   Physical Activity:   . Days of Exercise per Week:   . Minutes of Exercise per Session:   Stress:   . Feeling of Stress :   Social Connections:   . Frequency of Communication with Friends and Family:   . Frequency of Social Gatherings with Friends and Family:   . Attends Religious Services:   . Active Member of Clubs or Organizations:   . Attends Archivist Meetings:   [name] Marital Status:   Intimate Partner Violence:   . Fear of Current or Ex-Partner:   . Emotionally Abused:   [name] Physically Abused:   . Sexually Abused:     Past Medical History, Surgical history, Social history, and Family history were reviewed and updated as appropriate.   Please see review of systems for further details on the patient's review from today.   Objective:   Physical Exam:  There were no vitals taken for this visit.  Physical Exam Constitutional:      General: She is not in acute distress. Musculoskeletal:        General: No deformity.  Neurological:     Mental Status: She is alert and oriented to person, place, and time.     Cranial Nerves: No dysarthria.     Coordination: Coordination normal.  Psychiatric:        Attention and Perception: Attention and perception normal. She does not perceive auditory or visual hallucinations.        Mood and Affect: Mood is anxious. Mood is not depressed. Affect is not labile, blunt, angry or inappropriate.        Speech: Speech normal.        Behavior: Behavior normal. Behavior is cooperative.        Thought Content: Thought content normal. Thought content is not paranoid or delusional. Thought content does not include homicidal or suicidal ideation. Thought content does not include homicidal or suicidal plan.        Cognition and Memory: Cognition and memory normal.        Judgment: Judgment normal.     Comments: Insight intact     Lab  Review:     Component Value Date/Time   NA 138 07/07/2014 0455   K 3.5 (L) 07/07/2014 0455   CL 103 07/07/2014 0455   CO2 26 07/07/2014 0455   GLUCOSE 112 (H) 07/07/2014 0455   BUN 5 (L) 07/07/2014 0455   CREATININE 0.61 07/07/2014 0455   CALCIUM 7.9 (L) 07/07/2014 0455   PROT 7.8 07/05/2014 1853   ALBUMIN 3.6 07/05/2014 1853   AST 22 07/05/2014 1853   ALT 10 07/05/2014 1853   ALKPHOS 98 07/05/2014 1853   BILITOT 0.6 07/05/2014 1853   GFRNONAA >90 07/07/2014  0455   GFRAA >90 07/07/2014 0455       Component Value Date/Time   WBC 5.9 07/08/2014 0445   RBC 4.01 07/08/2014 0445   HGB 12.5 07/08/2014 0445   HCT 37.2 07/08/2014 0445   PLT 121 (L) 07/08/2014 0445   MCV 92.8 07/08/2014 0445   MCV 100.1 (A) 04/12/2013 1516   MCH 31.2 07/08/2014 0445   MCHC 33.6 07/08/2014 0445   RDW 13.1 07/08/2014 0445   LYMPHSABS 1.4 07/05/2014 2028   MONOABS 1.1 (H) 07/05/2014 2028   EOSABS 0.0 07/05/2014 2028   BASOSABS 0.0 07/05/2014 2028    No results found for: POCLITH, LITHIUM   No results found for: PHENYTOIN, PHENOBARB, VALPROATE, CBMZ   .res Assessment: Plan:    Sibley was seen today for follow-up and anxiety.  Diagnoses and all orders for this visit:  Panic disorder with agoraphobia -     diazepam (VALIUM) 10 MG tablet; Take 1 tablet (10 mg total) by mouth 4 (four) times daily.  PTSD (post-traumatic stress disorder)  Recurrent major depressive disorder, in full remission ([place])     Cont current meds, continue Diazepam 10 QID, Sertraline 200.  Satisfied.  Failed multiple others as noted above.    Supportive therapy dealing with triggers for anxiety and PTSD.  Stay active.  Disc Covid fears.  We discussed the short-term risks associated with benzodiazepines including sedation and increased fall risk among others.  Discussed long-term side effect risk including dependence, potential withdrawal symptoms, and the potential eventual dose-related risk of dementia.  But recent  studies from 2020 dispute this association between benzodiazepines and dementia risk. Newer studies in 2020 do not support an association with dementia.  Not able to do OK without BZ.    Disc low vitamin D and need for in from now on in Winter.  FU 6 mos   Lynder Parents, MD, DFAPA   Please see After Visit Summary for patient specific instructions.  No future appointments.  No orders of the defined types were placed in this encounter.   -------------------------------

## 2020-09-12 ENCOUNTER — Encounter: Payer: Self-pay | Admitting: Psychiatry

## 2020-09-12 ENCOUNTER — Telehealth (INDEPENDENT_AMBULATORY_CARE_PROVIDER_SITE_OTHER): Payer: Medicare Other | Admitting: Psychiatry

## 2020-09-12 DIAGNOSIS — F4001 Agoraphobia with panic disorder: Secondary | ICD-10-CM | POA: Diagnosis not present

## 2020-09-12 DIAGNOSIS — F4310 Post-traumatic stress disorder, unspecified: Secondary | ICD-10-CM | POA: Diagnosis not present

## 2020-09-12 DIAGNOSIS — F3342 Major depressive disorder, recurrent, in full remission: Secondary | ICD-10-CM | POA: Diagnosis not present

## 2020-09-12 MED ORDER — SERTRALINE HCL 100 MG PO TABS: 200.0000 mg | ORAL_TABLET | Freq: Every day | ORAL | 3 refills | Status: DC

## 2020-09-12 MED ORDER — DIAZEPAM 10 MG PO TABS: 10.0000 mg | ORAL_TABLET | Freq: Four times a day (QID) | ORAL | 5 refills | Status: DC

## 2020-09-12 NOTE — Progress Notes (Signed)
Shelley [name] [id number] 23-Feb-1956 64 y.o.   Video Visit via My Chart  I connected with pt by My Chart and verified that I am speaking with the correct person using two identifiers.   I discussed the limitations, risks, security and privacy concerns of performing an evaluation and management service by My Chart  and the availability of in person appointments. I also discussed with the patient that there may be a patient responsible charge related to this service. The patient expressed understanding and agreed to proceed.  I discussed the assessment and treatment plan with the patient. The patient was provided an opportunity to ask questions and all were answered. The patient agreed with the plan and demonstrated an understanding of the instructions.   The patient was advised to call back or seek an in-person evaluation if the symptoms worsen or if the condition fails to improve as anticipated.  I provided 30 minutes of video time during this encounter.  The patient was located at home and the provider was located office. Session started 120 to 135 PM.  Subjective:   Patient ID:  Shelley [name] is a 64 y.o. (DOB Jul 07, 1956) female.  Chief Complaint:  Chief Complaint  Patient presents with  . Follow-up  . Anxiety    Depression        Associated symptoms include headaches.  Shelley [name] presents to the office today for follow-up of panic disorder with agoraphobia, PTSD, and recurrent major depression.  seen in March 15, 2019 and December 2020 and no meds were changed.  Continued on sertraline 200 mg daily and diazepam 10 mg 4 times daily for panic disorder.  03/13/2020 appointment with the following noted: Stress of the pandemic.  Also some family issues which create stress.   Has some chronic family problems.    Has stayed well medically.  Good support from family. Quit smoking and gained 20#.  Still Working.  Still get triggered panic attacks and needs the meds.  Patient reports  stable mood and denies depressed or irritable moods except blue days.  Patient denies difficulty with sleep initiation or maintenance usually unless worry on her mind. Denies appetite disturbance.  Patient reports that energy and motivation have been good.  Patient denies any difficulty with concentration.  Patient denies any suicidal ideation.   Plan: Cont current meds, continue Diazepam 10 QID, Sertraline 200.  09/12/2020 appointment with following noted: R shoulder injury.  And can't use it very well.  But is allergic to steroids.   Disc questions about low vitamin D. Anxiety is somewhat chronic but no worse.  Triggered panic but not a lot of spontaneous. Patient reports stable mood and denies depressed or irritable moods.    Patient denies difficulty with sleep initiation or maintenance. Denies appetite disturbance.  Patient reports that energy and motivation have been good.  Patient denies any difficulty with concentration.  Patient denies any suicidal ideation.  Has a new grandson April 30, 2019.  [name] had 64 yo birthday.  [name]  66 yo old.  [name] is 64 years old.    Past Psychiatric Medication Trials: Trazodone, sertraline, duloxetine side effects, Wellbutrin jumpy, Lexapro headache, paroxetine nausea, Effexor nausea gabapentin irritability, Lyrica, Ambien side effects,  clonidine, primidone, Xanax, clonazepam,diazepam,  Review of Systems:  Review of Systems  Musculoskeletal: Positive for arthralgias and back pain.  Neurological: Positive for headaches. Negative for dizziness, tremors, weakness and light-headedness.  Psychiatric/Behavioral: Positive for depression.    Medications: I have reviewed the patient's current  medications.  Current Outpatient Medications  Medication Sig Dispense Refill  . Coenzyme Q10 (CO Q 10 PO) Take by mouth.    [name] VITAMIN D, CHOLECALCIFEROL, PO Take by mouth.    . diazepam (VALIUM) 10 MG tablet Take 1 tablet (10 mg total) by mouth 4 (four) times  daily. 120 tablet 5  . sertraline (ZOLOFT) 100 MG tablet Take 2 tablets (200 mg total) by mouth daily. 180 tablet 3   No current facility-administered medications for this visit.    Medication Side Effects: None  Allergies:  Allergies  Allergen Reactions  . Penicillins Anaphylaxis  . Lyrica [Pregabalin] Other (See Comments)    Walks into walls with this med  . Codeine Rash  . Tramadol Nausea And Vomiting and Rash    Past Medical History:  Diagnosis Date  . Anxiety   . Chronic migraine   . Depression   . Fibromyalgia   . Hepatitis C   . IBS (irritable bowel syndrome)     Family History  Problem Relation Age of Onset  . Cancer Mother   . Stroke Sister   . Hypertension Sister   . Hypertension Brother   . Diabetes Brother     Social History   Socioeconomic History  . Marital status: Divorced    Spouse name: Not on file  . Number of children: Not on file  . Years of education: Not on file  . Highest education level: Not on file  Occupational History    Comment: disability, 2010  Tobacco Use  . Smoking status: Former Smoker    Packs/day: 1.00    Years: 20.00    Pack years: 20.00  . Smokeless tobacco: Never Used  . Tobacco comment: uses smoke stick, just switched to half pack a day 7.10.14  Substance and Sexual Activity  . Alcohol use: No  . Drug use: No  . Sexual activity: Not Currently  Other Topics Concern  . Not on file  Social History Narrative   Lives with son, [name]   Social Determinants of Health   Financial Resource Strain: Not on file  Food Insecurity: Not on file  Transportation Needs: Not on file  Physical Activity: Not on file  Stress: Not on file  Social Connections: Not on file  Intimate Partner Violence: Not on file    Past Medical History, Surgical history, Social history, and Family history were reviewed and updated as appropriate.   Please see review of systems for further details on the patient's review from today.    Objective:   Physical Exam:  There were no vitals taken for this visit.  Physical Exam Neurological:     Mental Status: She is alert and oriented to person, place, and time.     Cranial Nerves: No dysarthria.  Psychiatric:        Attention and Perception: Attention and perception normal.        Mood and Affect: Mood is anxious. Mood is not depressed.        Speech: Speech normal.        Behavior: Behavior is cooperative.        Thought Content: Thought content normal. Thought content is not paranoid or delusional. Thought content does not include homicidal or suicidal ideation. Thought content does not include homicidal or suicidal plan.        Cognition and Memory: Cognition and memory normal.        Judgment: Judgment normal.     Comments: Insight intact  Lab Review:     Component Value Date/Time   NA 138 07/07/2014 0455   K 3.5 (L) 07/07/2014 0455   CL 103 07/07/2014 0455   CO2 26 07/07/2014 0455   GLUCOSE 112 (H) 07/07/2014 0455   BUN 5 (L) 07/07/2014 0455   CREATININE 0.61 07/07/2014 0455   CALCIUM 7.9 (L) 07/07/2014 0455   PROT 7.8 07/05/2014 1853   ALBUMIN 3.6 07/05/2014 1853   AST 22 07/05/2014 1853   ALT 10 07/05/2014 1853   ALKPHOS 98 07/05/2014 1853   BILITOT 0.6 07/05/2014 1853   GFRNONAA >90 07/07/2014 0455   GFRAA >90 07/07/2014 0455       Component Value Date/Time   WBC 5.9 07/08/2014 0445   RBC 4.01 07/08/2014 0445   HGB 12.5 07/08/2014 0445   HCT 37.2 07/08/2014 0445   PLT 121 (L) 07/08/2014 0445   MCV 92.8 07/08/2014 0445   MCV 100.1 (A) 04/12/2013 1516   MCH 31.2 07/08/2014 0445   MCHC 33.6 07/08/2014 0445   RDW 13.1 07/08/2014 0445   LYMPHSABS 1.4 07/05/2014 2028   MONOABS 1.1 (H) 07/05/2014 2028   EOSABS 0.0 07/05/2014 2028   BASOSABS 0.0 07/05/2014 2028    No results found for: POCLITH, LITHIUM   No results found for: PHENYTOIN, PHENOBARB, VALPROATE, CBMZ   .res Assessment: Plan:    Shelley [name] was seen today for follow-up and  anxiety.  Diagnoses and all orders for this visit:  Panic disorder with agoraphobia -     diazepam (VALIUM) 10 MG tablet; Take 1 tablet (10 mg total) by mouth 4 (four) times daily. -     sertraline (ZOLOFT) 100 MG tablet; Take 2 tablets (200 mg total) by mouth daily.  PTSD (post-traumatic stress disorder) -     sertraline (ZOLOFT) 100 MG tablet; Take 2 tablets (200 mg total) by mouth daily.  Recurrent major depressive disorder, in full remission ([place]) -     sertraline (ZOLOFT) 100 MG tablet; Take 2 tablets (200 mg total) by mouth daily.     Cont current meds, continue Diazepam 10 QID, Sertraline 200.  Satisfied.  Failed multiple others as noted above.    Supportive therapy dealing with triggers for anxiety and PTSD.  Stay active.  Disc Covid fears.  We discussed the short-term risks associated with benzodiazepines including sedation and increased fall risk among others.  Discussed long-term side effect risk including dependence, potential withdrawal symptoms, and the potential eventual dose-related risk of dementia.  But recent studies from 2020 dispute this association between benzodiazepines and dementia risk. Newer studies in 2020 do not support an association with dementia.  Not able to do OK without BZ.    Disc low vitamin D and need for in from now on.  Previous Vit D level 10.  Now Rx 50K weekly.  Disc risks low vitamin D in detail.  FU 6 mos   Lynder Parents, MD, DFAPA   Please see After Visit Summary for patient specific instructions.  Future Appointments  Date Time Provider [place]  09/12/2020  1:45 PM Cottle, Billey Co., MD [facility] None    No orders of the defined types were placed in this encounter.   -------------------------------

## 2020-11-16 ENCOUNTER — Other Ambulatory Visit: Payer: Self-pay | Admitting: Family Medicine

## 2020-11-16 DIAGNOSIS — E2839 Other primary ovarian failure: Secondary | ICD-10-CM

## 2021-03-13 ENCOUNTER — Ambulatory Visit (INDEPENDENT_AMBULATORY_CARE_PROVIDER_SITE_OTHER): Payer: Medicare Other | Admitting: Psychiatry

## 2021-03-13 ENCOUNTER — Other Ambulatory Visit: Payer: Self-pay

## 2021-03-13 ENCOUNTER — Encounter: Payer: Self-pay | Admitting: Psychiatry

## 2021-03-13 DIAGNOSIS — F3342 Major depressive disorder, recurrent, in full remission: Secondary | ICD-10-CM | POA: Diagnosis not present

## 2021-03-13 DIAGNOSIS — F4001 Agoraphobia with panic disorder: Secondary | ICD-10-CM | POA: Diagnosis not present

## 2021-03-13 DIAGNOSIS — F4310 Post-traumatic stress disorder, unspecified: Secondary | ICD-10-CM | POA: Diagnosis not present

## 2021-03-13 MED ORDER — SERTRALINE HCL 100 MG PO TABS: 200.0000 mg | ORAL_TABLET | Freq: Every day | ORAL | 3 refills | Status: DC

## 2021-03-13 MED ORDER — DIAZEPAM 10 MG PO TABS: 10.0000 mg | ORAL_TABLET | Freq: Four times a day (QID) | ORAL | 5 refills | Status: DC

## 2021-03-13 NOTE — Progress Notes (Signed)
Shelley [name] [id number] March 28, 1956 65 y.o.     Subjective:   Patient ID:  Shelley [name] is a 65 y.o. (DOB 1956-09-15) female.  Chief Complaint:  Chief Complaint  Patient presents with   Follow-up   Panic disorder with agoraphobia    Depression        Associated symptoms include headaches. Tamorah Hada presents to the office today for follow-up of panic disorder with agoraphobia, PTSD, and recurrent major depression.  seen in March 15, 2019 and December 2020 and no meds were changed.  Continued on sertraline 200 mg daily and diazepam 10 mg 4 times daily for panic disorder.  03/13/2020 appointment with the following noted: Stress of the pandemic.  Also some family issues which create stress.   Has some chronic family problems.    Has stayed well medically.  Good support from family. Quit smoking and gained 20#.  Still Working.  Still get triggered panic attacks and needs the meds.  Patient reports stable mood and denies depressed or irritable moods except blue days.  Patient denies difficulty with sleep initiation or maintenance usually unless worry on her mind. Denies appetite disturbance.  Patient reports that energy and motivation have been good.  Patient denies any difficulty with concentration.  Patient denies any suicidal ideation.   Plan: Cont current meds, continue Diazepam 10 QID, Sertraline 200.  09/12/2020 appointment with following noted: R shoulder injury.  And can't use it very well.  But is allergic to steroids.   Disc questions about low vitamin D. Anxiety is somewhat chronic but no worse.  Triggered panic but not a lot of spontaneous. Patient reports stable mood and denies depressed or irritable moods.    Patient denies difficulty with sleep initiation or maintenance. Denies appetite disturbance.  Patient reports that energy and motivation have been good.  Patient denies any difficulty with concentration.  Patient denies any suicidal ideation. Plan: no med  changes  03/13/2021 appointment with the following noted: Hx Hepatitis C.  RA. Started plant based diet. Stress inflation.  Chronic anxiety similar.  Less active and concerned about weight gain.   Ex H is her room mate and in tx for Hepatitis C and drinks and smokes a lot. She does not drink alcohol. Tolerating meds and not too sleepy from diazepam. Hx benefit EMDR remotely.  Still some triggers for anxiety and panic.  Mostly good.  Has a new grandson April 30, 2019.  [name] had 65 yo birthday.  [name]  67 yo old.  [name] is 65 years old.    Past Psychiatric Medication Trials:  sertraline, duloxetine side effects, Wellbutrin jumpy, Lexapro headache, paroxetine nausea, Effexor nausea gabapentin irritability, Lyrica,  clonidine, primidone,  Ambien side effects, Trazodone,  Xanax hangover, clonazepam hangover, diazepam,  Review of Systems:  Review of Systems  Constitutional:  Positive for unexpected weight change.  Musculoskeletal:  Positive for arthralgias and back pain.  Neurological:  Positive for headaches. Negative for dizziness, tremors, weakness and light-headedness.   Medications: I have reviewed the patient's current medications.  Current Outpatient Medications  Medication Sig Dispense Refill   diazepam (VALIUM) 10 MG tablet Take 1 tablet (10 mg total) by mouth 4 (four) times daily. 120 tablet 5   sertraline (ZOLOFT) 100 MG tablet Take 2 tablets (200 mg total) by mouth daily. 180 tablet 3   VITAMIN D, CHOLECALCIFEROL, PO Take by mouth.     Coenzyme Q10 (CO Q 10 PO) Take by mouth. (Patient not taking: Reported on 03/13/2021)  No current facility-administered medications for this visit.    Medication Side Effects: None  Allergies:  Allergies  Allergen Reactions   Penicillins Anaphylaxis   Lyrica [Pregabalin] Other (See Comments)    Walks into walls with this med   Codeine Rash   Tramadol Nausea And Vomiting and Rash    Past Medical History:  Diagnosis Date    Anxiety    Chronic migraine    Depression    Fibromyalgia    Hepatitis C    IBS (irritable bowel syndrome)     Family History  Problem Relation Age of Onset   Cancer Mother    Stroke Sister    Hypertension Sister    Hypertension Brother    Diabetes Brother     Social History   Socioeconomic History   Marital status: Divorced    Spouse name: Not on file   Number of children: Not on file   Years of education: Not on file   Highest education level: Not on file  Occupational History    Comment: disability, 2010  Tobacco Use   Smoking status: Former    Packs/day: 1.00    Years: 20.00    Pack years: 20.00    Types: Cigarettes   Smokeless tobacco: Never   Tobacco comments:    uses smoke stick, just switched to half pack a day 7.10.14  Substance and Sexual Activity   Alcohol use: No   Drug use: No   Sexual activity: Not Currently  Other Topics Concern   Not on file  Social History Narrative   Lives with son, [name]   Social Determinants of Health   Financial Resource Strain: Not on file  Food Insecurity: Not on file  Transportation Needs: Not on file  Physical Activity: Not on file  Stress: Not on file  Social Connections: Not on file  Intimate Partner Violence: Not on file    Past Medical History, Surgical history, Social history, and Family history were reviewed and updated as appropriate.   Please see review of systems for further details on the patient's review from today.   Objective:   Physical Exam:  There were no vitals taken for this visit.  Physical Exam Neurological:     Mental Status: She is alert and oriented to person, place, and time.     Cranial Nerves: No dysarthria.     Motor: Tremor present.  Psychiatric:        Attention and Perception: Attention and perception normal.        Mood and Affect: Mood is anxious. Mood is not depressed.        Speech: Speech normal. Speech is not slurred.        Behavior: Behavior is cooperative.         Thought Content: Thought content normal. Thought content is not paranoid or delusional. Thought content does not include homicidal or suicidal ideation. Thought content does not include homicidal or suicidal plan.        Cognition and Memory: Cognition and memory normal.        Judgment: Judgment normal.     Comments: Insight intact    Lab Review:     Component Value Date/Time   NA 138 07/07/2014 0455   K 3.5 (L) 07/07/2014 0455   CL 103 07/07/2014 0455   CO2 26 07/07/2014 0455   GLUCOSE 112 (H) 07/07/2014 0455   BUN 5 (L) 07/07/2014 0455   CREATININE 0.61 07/07/2014 0455   CALCIUM 7.9 (L)  07/07/2014 0455   PROT 7.8 07/05/2014 1853   ALBUMIN 3.6 07/05/2014 1853   AST 22 07/05/2014 1853   ALT 10 07/05/2014 1853   ALKPHOS 98 07/05/2014 1853   BILITOT 0.6 07/05/2014 1853   GFRNONAA >90 07/07/2014 0455   GFRAA >90 07/07/2014 0455       Component Value Date/Time   WBC 5.9 07/08/2014 0445   RBC 4.01 07/08/2014 0445   HGB 12.5 07/08/2014 0445   HCT 37.2 07/08/2014 0445   PLT 121 (L) 07/08/2014 0445   MCV 92.8 07/08/2014 0445   MCV 100.1 (A) 04/12/2013 1516   MCH 31.2 07/08/2014 0445   MCHC 33.6 07/08/2014 0445   RDW 13.1 07/08/2014 0445   LYMPHSABS 1.4 07/05/2014 2028   MONOABS 1.1 (H) 07/05/2014 2028   EOSABS 0.0 07/05/2014 2028   BASOSABS 0.0 07/05/2014 2028    No results found for: POCLITH, LITHIUM   No results found for: PHENYTOIN, PHENOBARB, VALPROATE, CBMZ   .res Assessment: Plan:    Redith was seen today for follow-up and panic disorder with agoraphobia.  Diagnoses and all orders for this visit:  Panic disorder with agoraphobia  PTSD (post-traumatic stress disorder)  Recurrent major depressive disorder, in full remission ([place])    Cont current meds, continue Diazepam 10 QID, Sertraline 200.  Satisfied.  Failed multiple others as noted above.   Occ triggered panic and some degree of avoidance but manageable.  Supportive therapy dealing with triggers for  anxiety and PTSD.  Stay active.  Disc Covid fears.  Disc weight concerns and diet options.  Encourage exercise and walking.    We discussed the short-term risks associated with benzodiazepines including sedation and increased fall risk among others.  Discussed long-term side effect risk including dependence, potential withdrawal symptoms, and the potential eventual dose-related risk of dementia.  But recent studies from 2020 dispute this association between benzodiazepines and dementia risk. Newer studies in 2020 do not support an association with dementia.  Not able to do OK without BZ.    Disc low vitamin D and need for in from now on.  Previous Vit D level 10.  Now Rx 50K weekly.  Disc risks low vitamin D in detail.  FU 6-9 mos   Lynder Parents, MD, DFAPA   Please see After Visit Summary for patient specific instructions.  Future Appointments  Date Time Provider [place]  04/20/2021 12:00 PM [facility] DX DEXA 1 [facility]    No orders of the defined types were placed in this encounter.   -------------------------------

## 2021-04-20 ENCOUNTER — Other Ambulatory Visit: Payer: Medicare HMO

## 2021-09-13 ENCOUNTER — Ambulatory Visit (INDEPENDENT_AMBULATORY_CARE_PROVIDER_SITE_OTHER): Payer: Medicare Other | Admitting: Psychiatry

## 2021-09-13 ENCOUNTER — Encounter: Payer: Self-pay | Admitting: Psychiatry

## 2021-09-13 DIAGNOSIS — F3342 Major depressive disorder, recurrent, in full remission: Secondary | ICD-10-CM

## 2021-09-13 DIAGNOSIS — F4001 Agoraphobia with panic disorder: Secondary | ICD-10-CM

## 2021-09-13 DIAGNOSIS — F4310 Post-traumatic stress disorder, unspecified: Secondary | ICD-10-CM | POA: Diagnosis not present

## 2021-09-13 MED ORDER — DIAZEPAM 10 MG PO TABS
10.0000 mg | ORAL_TABLET | Freq: Four times a day (QID) | ORAL | 5 refills | Status: DC
Start: 2021-09-13 — End: 2022-03-13
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
## 2022-03-13 ENCOUNTER — Other Ambulatory Visit: Payer: Self-pay | Admitting: Psychiatry

## 2022-03-13 DIAGNOSIS — F4001 Agoraphobia with panic disorder: Secondary | ICD-10-CM

## 2022-03-13 NOTE — Telephone Encounter (Signed)
Last filled 5/17 appt on 7/17

## 2022-04-01 ENCOUNTER — Ambulatory Visit: Payer: Medicare Other | Admitting: Psychiatry

## 2022-04-03 ENCOUNTER — Other Ambulatory Visit: Payer: Self-pay | Admitting: Psychiatry

## 2022-04-03 DIAGNOSIS — F3342 Major depressive disorder, recurrent, in full remission: Secondary | ICD-10-CM

## 2022-04-03 DIAGNOSIS — F4310 Post-traumatic stress disorder, unspecified: Secondary | ICD-10-CM

## 2022-04-03 DIAGNOSIS — F4001 Agoraphobia with panic disorder: Secondary | ICD-10-CM

## 2022-04-15 ENCOUNTER — Ambulatory Visit (INDEPENDENT_AMBULATORY_CARE_PROVIDER_SITE_OTHER): Payer: Medicare Other | Admitting: Psychiatry

## 2022-04-15 ENCOUNTER — Encounter: Payer: Self-pay | Admitting: Psychiatry

## 2022-04-15 DIAGNOSIS — F4001 Agoraphobia with panic disorder: Secondary | ICD-10-CM | POA: Diagnosis not present

## 2022-04-15 DIAGNOSIS — F4310 Post-traumatic stress disorder, unspecified: Secondary | ICD-10-CM | POA: Diagnosis not present

## 2022-04-15 DIAGNOSIS — F3342 Major depressive disorder, recurrent, in full remission: Secondary | ICD-10-CM

## 2022-04-15 MED ORDER — DIAZEPAM 10 MG PO TABS: 10.0000 mg | ORAL_TABLET | Freq: Four times a day (QID) | ORAL | 5 refills | Status: DC

## 2022-04-15 MED ORDER — SERTRALINE HCL 100 MG PO TABS: 200.0000 mg | ORAL_TABLET | Freq: Every day | ORAL | 1 refills | Status: DC
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
## 2022-06-10 ENCOUNTER — Encounter: Payer: Self-pay | Admitting: Pulmonary Disease

## 2022-06-10 ENCOUNTER — Ambulatory Visit (INDEPENDENT_AMBULATORY_CARE_PROVIDER_SITE_OTHER): Payer: Medicare Other | Admitting: Pulmonary Disease

## 2022-06-10 VITALS — BP 100/70 | HR 121 | Ht 66.5 in | Wt 152.0 lb

## 2022-06-10 DIAGNOSIS — R911 Solitary pulmonary nodule: Secondary | ICD-10-CM

## 2022-06-10 DIAGNOSIS — Z87891 Personal history of nicotine dependence: Secondary | ICD-10-CM | POA: Diagnosis not present

## 2022-06-10 MED ORDER — TRELEGY ELLIPTA 100-62.5-25 MCG/ACT IN AEPB: 1.0000 | INHALATION_SPRAY | Freq: Every day | RESPIRATORY_TRACT | 6 refills | Status: AC

## 2022-06-10 NOTE — Progress Notes (Signed)
Synopsis: Referred in September 2023 for pulmonary nodules by Lawerance Cruel, MD  Subjective:   PATIENT ID: Shelley [name] GENDER: female DOB: Jun 20, 1956, MRN: [id number]  Chief Complaint  Patient presents with   Consult    Lung nodule.    PMH of tobacco use, migraines, hep c. Patient states that she had Covid. She has had trouble with prednisone in the past. She has had rash in the past she felt was related to prednisone. She took antiviral for Hep C. Use to be a smoker, roommate does smoke. Her last cigarette was 2-3 years ago. She was started trelegy samples by her primary care provider.  She has been using these for the past several weeks off and on.  Not using them regularly.  She is not really sure if it makes a difference but she is willing to continue to use it.  She has not had pulmonary function test in the past.  Reviewed her lung cancer screening CT that shows a nodular/rounded opacity in the posterior portion of the left lower lobe.    Past Medical History:  Diagnosis Date   Anxiety    Chronic migraine    Depression    Fibromyalgia    Hepatitis C    IBS (irritable bowel syndrome)      Family History  Problem Relation Age of Onset   Cancer Mother    Stroke Sister    Hypertension Sister    Hypertension Brother    Diabetes Brother      Past Surgical History:  Procedure Laterality Date   ABDOMINAL HYSTERECTOMY     TUBAL LIGATION      Social History   Socioeconomic History   Marital status: Divorced    Spouse name: Not on file   Number of children: Not on file   Years of education: Not on file   Highest education level: Not on file  Occupational History    Comment: disability, 2010  Tobacco Use   Smoking status: Former    Packs/day: 1.00    Years: 20.00    Total pack years: 20.00    Types: Cigarettes   Smokeless tobacco: Never   Tobacco comments:    uses smoke stick, just switched to half pack a day 7.10.14  Substance and Sexual Activity    Alcohol use: No   Drug use: No   Sexual activity: Not Currently  Other Topics Concern   Not on file  Social History Narrative   Lives with son, Shelley [name]   Social Determinants of Health   Financial Resource Strain: Not on file  Food Insecurity: Not on file  Transportation Needs: Not on file  Physical Activity: Not on file  Stress: Not on file  Social Connections: Not on file  Intimate Partner Violence: Not on file     Allergies  Allergen Reactions   Penicillins Anaphylaxis   Lyrica [Pregabalin] Other (See Comments)    Walks into walls with this med   Codeine Rash   Tramadol Nausea And Vomiting and Rash     Outpatient Medications Prior to Visit  Medication Sig Dispense Refill   diazepam (VALIUM) 10 MG tablet Take 1 tablet (10 mg total) by mouth 4 (four) times daily. 120 tablet 5   sertraline (ZOLOFT) 100 MG tablet Take 2 tablets (200 mg total) by mouth daily. 180 tablet 1   Sofosbuvir-Velpatasvir (EPCLUSA) 400-100 MG TABS Take by mouth.     VITAMIN D, CHOLECALCIFEROL, PO Take by mouth.  No facility-administered medications prior to visit.    Review of Systems  Constitutional:  Negative for chills, fever, malaise/fatigue and weight loss.  HENT:  Negative for hearing loss, sore throat and tinnitus.   Eyes:  Negative for blurred vision and double vision.  Respiratory:  Negative for cough, hemoptysis, sputum production, shortness of breath, wheezing and stridor.   Cardiovascular:  Negative for chest pain, palpitations, orthopnea, leg swelling and PND.  Gastrointestinal:  Negative for abdominal pain, constipation, diarrhea, heartburn, nausea and vomiting.  Genitourinary:  Negative for dysuria, hematuria and urgency.  Musculoskeletal:  Negative for joint pain and myalgias.  Skin:  Negative for itching and rash.  Neurological:  Negative for dizziness, tingling, weakness and headaches.  Endo/Heme/Allergies:  Negative for environmental allergies. Does not bruise/bleed easily.   Psychiatric/Behavioral:  Negative for depression. The patient is not nervous/anxious and does not have insomnia.   All other systems reviewed and are negative.    Objective:  Physical Exam Vitals reviewed.  Constitutional:      General: She is not in acute distress.    Appearance: She is well-developed.  HENT:     Head: Normocephalic and atraumatic.  Eyes:     General: No scleral icterus.    Conjunctiva/sclera: Conjunctivae normal.     Pupils: Pupils are equal, round, and reactive to light.  Neck:     Vascular: No JVD.     Trachea: No tracheal deviation.  Cardiovascular:     Rate and Rhythm: Normal rate and regular rhythm.     Heart sounds: Normal heart sounds. No murmur heard. Pulmonary:     Effort: Pulmonary effort is normal. No tachypnea, accessory muscle usage or respiratory distress.     Breath sounds: No stridor. No wheezing, rhonchi or rales.  Abdominal:     General: There is no distension.     Palpations: Abdomen is soft.     Tenderness: There is no abdominal tenderness.  Musculoskeletal:        General: No tenderness.     Cervical back: Neck supple.  Lymphadenopathy:     Cervical: No cervical adenopathy.  Skin:    General: Skin is warm and dry.     Capillary Refill: Capillary refill takes less than 2 seconds.     Findings: No rash.  Neurological:     Mental Status: She is alert and oriented to person, place, and time.  Psychiatric:        Behavior: Behavior normal.      Vitals:   06/10/22 1411  BP: 100/70  Pulse: (!) 121  SpO2: 96%  Weight: 152 lb (68.9 kg)  Height: 5' 6.5" (1.689 m)   96% on RA BMI Readings from Last 3 Encounters:  06/10/22 24.17 kg/m  07/08/14 22.56 kg/m  04/12/13 21.46 kg/m   Wt Readings from Last 3 Encounters:  06/10/22 152 lb (68.9 kg)  07/08/14 139 lb 12.8 oz (63.4 kg)  04/12/13 135 lb (61.2 kg)     CBC    Component Value Date/Time   WBC 5.9 07/08/2014 0445   RBC 4.01 07/08/2014 0445   HGB 12.5 07/08/2014  0445   HCT 37.2 07/08/2014 0445   PLT 121 (L) 07/08/2014 0445   MCV 92.8 07/08/2014 0445   MCV 100.1 (A) 04/12/2013 1516   MCH 31.2 07/08/2014 0445   MCHC 33.6 07/08/2014 0445   RDW 13.1 07/08/2014 0445   LYMPHSABS 1.4 07/05/2014 2028   MONOABS 1.1 (H) 07/05/2014 2028   EOSABS 0.0 07/05/2014 2028  BASOSABS 0.0 07/05/2014 2028     Chest Imaging: August 2023 lung cancer screening CT: Nodular opacity in the left lower lobe The patient's images have been independently reviewed by me.    Pulmonary Functions Testing Results:     No data to display          FeNO:   Pathology:   Echocardiogram:   Heart Catheterization:     Assessment & Plan:     ICD-10-CM   1. Lung nodule  R91.1 CT Chest Wo Contrast    2. Nodule of lower lobe of left lung  R91.1     3. History of tobacco use  Z87.891       Discussion:  This is a 66 year old female, history of tobacco use, lung nodule.  She currently using Trelegy samples.  Not really sure it makes a whole lot difference in her breathing.  Plan: Today in the office we reviewed her lung cancer screening CT. Left lower lobe pulmonary nodule/rounded opacity on her lung cancer screening CT We will plan for a repeat noncontrasted CT chest in 3 months. We will have her follow-up in our office in 3 months to review results. Continue Trelegy, she will need PFTs at some point. New refill for Trelegy given today.   Current Outpatient Medications:    Fluticasone-Umeclidin-Vilant (TRELEGY ELLIPTA) 100-62.5-25 MCG/ACT AEPB, Inhale 1 each into the lungs at bedtime., Disp: 1 each, Rfl: 6   diazepam (VALIUM) 10 MG tablet, Take 1 tablet (10 mg total) by mouth 4 (four) times daily., Disp: 120 tablet, Rfl: 5   sertraline (ZOLOFT) 100 MG tablet, Take 2 tablets (200 mg total) by mouth daily., Disp: 180 tablet, Rfl: 1   Sofosbuvir-Velpatasvir (EPCLUSA) 400-100 MG TABS, Take by mouth., Disp: , Rfl:    VITAMIN D, CHOLECALCIFEROL, PO, Take by mouth.,  Disp: , Rfl:    Garner Nash, DO [facility] 06/10/2022 2:41 PM

## 2022-06-10 NOTE — Patient Instructions (Signed)
Thank you for visiting Dr. Valeta Harms at [facility]. Today we recommend the following:  Orders Placed This Encounter  Procedures   CT Chest Wo Contrast   Return in about 3 months (around 09/09/2022) for with Eric Form, NP.    Please do your part to reduce the spread of COVID-19.

## 2022-09-06 ENCOUNTER — Ambulatory Visit (HOSPITAL_COMMUNITY): Payer: Medicare Other

## 2022-09-11 ENCOUNTER — Ambulatory Visit: Payer: Medicare HMO | Admitting: Pulmonary Disease

## 2022-09-17 ENCOUNTER — Ambulatory Visit (HOSPITAL_COMMUNITY)
Admission: RE | Admit: 2022-09-17 | Discharge: 2022-09-17 | Disposition: A | Discharge status: Home / Self Care | Payer: Medicare Other | Source: Ambulatory Visit | Attending: Pulmonary Disease | Admitting: Pulmonary Disease

## 2022-09-17 DIAGNOSIS — R911 Solitary pulmonary nodule: Secondary | ICD-10-CM | POA: Diagnosis present

## 2022-09-19 ENCOUNTER — Encounter: Payer: Self-pay | Admitting: Nurse Practitioner

## 2022-09-19 ENCOUNTER — Ambulatory Visit: Payer: Medicare Other | Admitting: Nurse Practitioner

## 2022-09-19 VITALS — BP 110/90 | HR 108 | Temp 98.0°F | Ht 66.5 in | Wt 143.0 lb

## 2022-09-19 DIAGNOSIS — J441 Chronic obstructive pulmonary disease with (acute) exacerbation: Secondary | ICD-10-CM

## 2022-09-19 DIAGNOSIS — Z87891 Personal history of nicotine dependence: Secondary | ICD-10-CM | POA: Diagnosis not present

## 2022-09-19 DIAGNOSIS — J439 Emphysema, unspecified: Secondary | ICD-10-CM | POA: Diagnosis not present

## 2022-09-19 DIAGNOSIS — R0609 Other forms of dyspnea: Secondary | ICD-10-CM | POA: Diagnosis not present

## 2022-09-19 DIAGNOSIS — R911 Solitary pulmonary nodule: Secondary | ICD-10-CM

## 2022-09-19 MED ORDER — ALBUTEROL SULFATE HFA 108 (90 BASE) MCG/ACT IN AERS: 2.0000 | INHALATION_SPRAY | Freq: Four times a day (QID) | RESPIRATORY_TRACT | 6 refills | Status: AC | PRN

## 2022-09-19 MED ORDER — LEVOFLOXACIN 500 MG PO TABS: 500.0000 mg | ORAL_TABLET | Freq: Every day | ORAL | 0 refills | Status: AC

## 2022-09-19 MED ORDER — METHYLPREDNISOLONE 4 MG PO TBPK: ORAL_TABLET | ORAL | 0 refills | Status: DC

## 2022-09-19 MED ORDER — PREDNISONE 20 MG PO TABS: 40.0000 mg | ORAL_TABLET | Freq: Every day | ORAL | 0 refills | Status: DC

## 2022-09-19 NOTE — Patient Instructions (Addendum)
Restart Trelegy 1 puff daily. Brush tongue and rinse mouth afterwards Continue Albuterol inhaler 2 puffs every 6 hours as needed for shortness of breath or wheezing. Notify if symptoms persist despite rescue inhaler/neb use.  Levaquin 500 mg daily for 7 days. Take with food. Monitor for any  Medrol dosepak  Guaifenesin [phone] Twice daily for cough/congestion  Follow up in 2 weeks with Dr. Valeta Harms or Alanson Aly. If symptoms do not improve or worsen, please contact office for sooner follow up or seek emergency care.

## 2022-09-19 NOTE — Assessment & Plan Note (Signed)
Awaiting results from CT chest 12/19. Will review in 2 weeks when she returns for follow up, if not sooner.

## 2022-09-19 NOTE — Assessment & Plan Note (Signed)
Long term smoker with emphysema on imaging. No formal PFTs; high suspicion for underlying COPD. She had recent viral illness with severe symptoms. Slow to improve with residual increased SOB and productive cough. Minimal response to azithromycin. Concern for unresolved pna. She has multiple drug allergies. Hold off on imaging today as she just completed CT chest on 12/19, which we are awaiting results, and VS are stable/non-toxic appearing. We will treat her with empiric levaquin 7 day course and medrol dosepak (unable to tolerate prednisone in past). Medication education provided. Strict return precautions. Mucociliary clearance therapies encouraged.  Advised to restart maintenance therapy. Offered LAMA/LABA therapy given trouble with ICS. She would prefer to retry Trelegy as she just refilled it. Oral hygiene measures reviewed. Provided with albuterol for rescue. She will need formal PFTs, which we will schedule upon her return.  Patient Instructions  Restart Trelegy 1 puff daily. Brush tongue and rinse mouth afterwards Continue Albuterol inhaler 2 puffs every 6 hours as needed for shortness of breath or wheezing. Notify if symptoms persist despite rescue inhaler/neb use.  Levaquin 500 mg daily for 7 days. Take with food. Monitor for any  Medrol dosepak  Guaifenesin [phone] mg Twice daily for cough/congestion  Follow up in 2 weeks with Dr. Valeta Harms or Alanson Aly. If symptoms do not improve or worsen, please contact office for sooner follow up or seek emergency care.

## 2022-09-19 NOTE — Progress Notes (Signed)
Does not get the RSV vaccine (she has been told not go get it because she has RA).  Patient seen in the office today and instructed on use of Albuterol.  Patient expressed understanding and demonstrated technique.

## 2022-09-19 NOTE — Progress Notes (Signed)
$'@Patient'S$  ID: Shelley [name], female    DOB: 05-14-1956, 66 y.o.   MRN: [id number]  Chief Complaint  Patient presents with   Follow-up    Sick November 3rd, temp of 103, coughing up blood, vomiting, diarrhea.  (-) for flu and covid.      Referring provider: Hayden Rasmussen, MD  HPI: 66 year old female, former smoker followed for lung nodule. She is a patient of Dr. Juline Patch and last seen in office 06/10/2022. Past medical history significant for migraines, hep C post treatment, fibromyalgia, PTSD, anxiety.   TEST/EVENTS:  05/06/2022 CT chest outside images: nodular opacity in the LLL. Moderate emphysema   06/10/2022: OV with Dr. Valeta Harms for initial consult. Referred for abnormal lung cancer screening CT that showed nodular/rounded opacity in the posterior portion of the LLL. Plan for repeat CT chest in 3 months. PCP started her on Trelegy for DOE; unsure if it's helping. Will need formal PFTs.  09/19/2022: Today - follow up Patient presents today for follow up, which was supposed to be for review of her CT chest. She just had this completed 12/19 and results are not available yet. She tells me that she was sick around 11/3 with some sort of virus. Flu and COVID were negative. She had a very bad cough, started coughing up blood, had high fevers up to 103, vomiting, and diarrhea. She had significant fatigue and weakness. She did not want to go to the ED so she stayed at home. Her PCP did treat her with a z pack. Never completed any imaging prior to her CT 12/19. Today, she tells me she is feeling better but still fatigued and doesn't feel quite back to her baseline. She is also continuing to have a productive cough with yellow sputum. Breathing feels a little more short winded than her baseline but overall, improved. She has not had any further episodes of hemoptysis or recurrence of fevers. Eating and drinking well now; able to keep fluids/food down without trouble. She did stop her Trelegy because she  was having trouble with a sore mouth. This resolved after she came off the inhaler.   Allergies  Allergen Reactions   Penicillins Anaphylaxis   Lyrica [Pregabalin] Other (See Comments)    Walks into walls with this med   Codeine Rash   Tramadol Nausea And Vomiting and Rash    Immunization History  Administered Date(s) Administered   Moderna Sars-Covid-2 Vaccination 01/26/2020, 09/15/2020    Past Medical History:  Diagnosis Date   Anxiety    Chronic migraine    Depression    Fibromyalgia    Hepatitis C    IBS (irritable bowel syndrome)     Tobacco History: Social History   Tobacco Use  Smoking Status Former   Packs/day: 1.00   Years: 20.00   Total pack years: 20.00   Types: Cigarettes  Smokeless Tobacco Never  Tobacco Comments   uses smoke stick, just switched to half pack a day 7.10.14   Counseling given: Not Answered Tobacco comments: uses smoke stick, just switched to half pack a day 7.10.14   Outpatient Medications Prior to Visit  Medication Sig Dispense Refill   diazepam (VALIUM) 10 MG tablet Take 1 tablet (10 mg total) by mouth 4 (four) times daily. 120 tablet 5   Fluticasone-Umeclidin-Vilant (TRELEGY ELLIPTA) 100-62.5-25 MCG/ACT AEPB Inhale 1 each into the lungs at bedtime. 1 each 6   sertraline (ZOLOFT) 100 MG tablet Take 2 tablets (200 mg total) by mouth daily. [place]  tablet 1   VITAMIN D, CHOLECALCIFEROL, PO Take by mouth.     Sofosbuvir-Velpatasvir (EPCLUSA) 400-100 MG TABS Take by mouth.     No facility-administered medications prior to visit.     Review of Systems:   Constitutional: No weight loss or gain, night sweats, fevers, chills. +fatigue, lassitude. HEENT: No headaches, difficulty swallowing, tooth/dental problems, or sore throat. No sneezing, itching, ear ache, nasal congestion, or post nasal drip CV:  No chest pain, orthopnea, PND, swelling in lower extremities, anasarca, dizziness, palpitations, syncope Resp: +shortness of breath with  exertion; productive cough; chest congestion; occasional wheeze.  No hemoptysis. No chest wall deformity GI:  No heartburn, indigestion, abdominal pain, nausea, vomiting, diarrhea, change in bowel habits, loss of appetite, bloody stools.  GU: No dysuria, change in color of urine, urgency or frequency.  No flank pain, no hematuria  Skin: No rash, lesions, ulcerations MSK:  No joint pain or swelling.   Neuro: No dizziness or lightheadedness.  Psych: No depression or anxiety. Mood stable.     Physical Exam:  BP (!) 110/90 (BP Location: Right Arm, Patient Position: Sitting, Cuff Size: Normal)   Pulse (!) 108   Temp 98 F (36.7 C) (Oral)   Ht 5' 6.5" (1.689 m)   Wt 143 lb (64.9 kg)   SpO2 97%   BMI 22.74 kg/m   GEN: Pleasant, interactive, well-nourished; in no acute distress. HEENT:  Normocephalic and atraumatic. PERRLA. Sclera white. Nasal turbinates pink, moist and patent bilaterally. No rhinorrhea present. Oropharynx pink and moist, without exudate or edema. No lesions, ulcerations, or postnasal drip.  NECK:  Supple w/ fair ROM. No JVD present. Normal carotid impulses w/o bruits. Thyroid symmetrical with no goiter or nodules palpated. No lymphadenopathy.   CV: RRR, no m/r/g, no peripheral edema. Pulses intact, +2 bilaterally. No cyanosis, pallor or clubbing. PULMONARY:  Unlabored, regular breathing. Expiratory wheezes bilaterally A&P; congested cough. No accessory muscle use.  GI: BS present and normoactive. Soft, non-tender to palpation. No organomegaly or masses detected.  MSK: No erythema, warmth or tenderness. Cap refil <2 sec all extrem. No deformities or joint swelling noted.  Neuro: A/Ox3. No focal deficits noted.   Skin: Warm, no lesions or rashe Psych: Normal affect and behavior. Judgement and thought content appropriate.     Lab Results:  CBC    Component Value Date/Time   WBC 5.9 07/08/2014 0445   RBC 4.01 07/08/2014 0445   HGB 12.5 07/08/2014 0445   HCT 37.2  07/08/2014 0445   PLT 121 (L) 07/08/2014 0445   MCV 92.8 07/08/2014 0445   MCV 100.1 (A) 04/12/2013 1516   MCH 31.2 07/08/2014 0445   MCHC 33.6 07/08/2014 0445   RDW 13.1 07/08/2014 0445   LYMPHSABS 1.4 07/05/2014 2028   MONOABS 1.1 (H) 07/05/2014 2028   EOSABS 0.0 07/05/2014 2028   BASOSABS 0.0 07/05/2014 2028    BMET    Component Value Date/Time   NA 138 07/07/2014 0455   K 3.5 (L) 07/07/2014 0455   CL 103 07/07/2014 0455   CO2 26 07/07/2014 0455   GLUCOSE 112 (H) 07/07/2014 0455   BUN 5 (L) 07/07/2014 0455   CREATININE 0.61 07/07/2014 0455   CALCIUM 7.9 (L) 07/07/2014 0455   GFRNONAA >90 07/07/2014 0455   GFRAA >90 07/07/2014 0455    BNP No results found for: "BNP"   Imaging:  No results found.        No data to display  No results found for: "NITRICOXIDE"      Assessment & Plan:   Acute exacerbation of emphysema ([place]) Long term smoker with emphysema on imaging. No formal PFTs; high suspicion for underlying COPD. She had recent viral illness with severe symptoms. Slow to improve with residual increased SOB and productive cough. Minimal response to azithromycin. Concern for unresolved pna. She has multiple drug allergies. Hold off on imaging today as she just completed CT chest on 12/19, which we are awaiting results, and VS are stable/non-toxic appearing. We will treat her with empiric levaquin 7 day course and medrol dosepak (unable to tolerate prednisone in past). Medication education provided. Strict return precautions. Mucociliary clearance therapies encouraged.  Advised to restart maintenance therapy. Offered LAMA/LABA therapy given trouble with ICS. She would prefer to retry Trelegy as she just refilled it. Oral hygiene measures reviewed. Provided with albuterol for rescue. She will need formal PFTs, which we will schedule upon her return.  Patient Instructions  Restart Trelegy 1 puff daily. Brush tongue and rinse mouth afterwards Continue  Albuterol inhaler 2 puffs every 6 hours as needed for shortness of breath or wheezing. Notify if symptoms persist despite rescue inhaler/neb use.  Levaquin 500 mg daily for 7 days. Take with food. Monitor for any  Medrol dosepak  Guaifenesin [phone] Twice daily for cough/congestion  Follow up in 2 weeks with Dr. Valeta Harms or Alanson Aly. If symptoms do not improve or worsen, please contact office for sooner follow up or seek emergency care.    Lung nodule Awaiting results from CT chest 12/19. Will review in 2 weeks when she returns for follow up, if not sooner.   I spent 45 minutes of dedicated to the care of this patient on the date of this encounter to include pre-visit review of records, face-to-face time with the patient discussing conditions above, post visit ordering of testing, clinical documentation with the electronic health record, making appropriate referrals as documented, and communicating necessary findings to members of the patients care team.  Clayton Bibles, NP 09/19/2022  Pt aware and understands NP's role.

## 2022-09-20 ENCOUNTER — Telehealth: Payer: Self-pay | Admitting: Nurse Practitioner

## 2022-09-20 DIAGNOSIS — R911 Solitary pulmonary nodule: Secondary | ICD-10-CM

## 2022-09-20 NOTE — Telephone Encounter (Signed)
Called Pt to give results of CT Scan. Pt stated understanding. I also put in an order for a future CT Scan in 6-8 weeks and Pt statd that she would make another appointment after seeing Katie Cobb on 1/11//2024. Nothing further needed.

## 2022-09-20 NOTE — Telephone Encounter (Signed)
Called pt to give her CT appointment info and she wants someone to explain the results of the CT from 12/19.  She doesn't understand.

## 2022-09-20 NOTE — Telephone Encounter (Signed)
Received a call report for patient's CT scan that was completed on 09/17/22. Below is a copy of the impression:   MPRESSION: 1. There is a new focal infiltrate in the posterior right upper lobe abutting the major fissure measuring 2.8 x 1.0 x 0.9 cm. This may be infectious/inflammatory, but neoplasm can not be excluded. Please correlate clinically.    Dr. Valeta Harms, can you please advise? Thanks!

## 2022-09-20 NOTE — Telephone Encounter (Signed)
Call report [facility].

## 2022-09-20 NOTE — Progress Notes (Signed)
I reviewed her CT results.  Patient was just seen by Roxan Diesel, NP in the office.  Currently undergoing treatment with Levaquin.  She needs to complete this course of antibiotics and have a CT scan ordered.  This has been completed already by triage.  She will need an appointment to follow-up after that.  This is just a message FYI to Roxan Diesel, NP for when she returns to work after the holidays.  Thanks,  BLI  Garner Nash, DO [facility] 09/20/2022 3:03 PM

## 2022-09-20 NOTE — Telephone Encounter (Signed)
Called patient but she did not answer. Left message for patient to call back.  

## 2022-10-10 ENCOUNTER — Telehealth (INDEPENDENT_AMBULATORY_CARE_PROVIDER_SITE_OTHER): Payer: Medicare HMO | Admitting: Nurse Practitioner

## 2022-10-10 ENCOUNTER — Telehealth: Payer: Self-pay | Admitting: Student

## 2022-10-10 ENCOUNTER — Encounter: Payer: Self-pay | Admitting: Nurse Practitioner

## 2022-10-10 ENCOUNTER — Ambulatory Visit: Payer: No Typology Code available for payment source | Admitting: Nurse Practitioner

## 2022-10-10 VITALS — Ht 66.0 in | Wt 143.0 lb

## 2022-10-10 DIAGNOSIS — J069 Acute upper respiratory infection, unspecified: Secondary | ICD-10-CM | POA: Diagnosis not present

## 2022-10-10 DIAGNOSIS — J432 Centrilobular emphysema: Secondary | ICD-10-CM | POA: Diagnosis not present

## 2022-10-10 DIAGNOSIS — J441 Chronic obstructive pulmonary disease with (acute) exacerbation: Secondary | ICD-10-CM | POA: Diagnosis not present

## 2022-10-10 DIAGNOSIS — J189 Pneumonia, unspecified organism: Secondary | ICD-10-CM | POA: Insufficient documentation

## 2022-10-10 MED ORDER — METHYLPREDNISOLONE 4 MG PO TBPK: ORAL_TABLET | ORAL | 0 refills | Status: DC

## 2022-10-10 NOTE — Assessment & Plan Note (Signed)
CT chest from December 2023 with pna. She was treated with 7 day course of levaquin. She clinically improved and was stable for 2 weeks. Symptoms today most consistent with viral illness and not recurrent pna. Strict return precautions. Plans for repeat CT chest in February to ensure resolution and follow up on nodules.

## 2022-10-10 NOTE — Assessment & Plan Note (Signed)
Emphysema on imaging with suspected underlying obstructive lung disease. She has yet to undergo formal pulmonary function testing. Benefits from use of Trelegy. Action plan in place.

## 2022-10-10 NOTE — Telephone Encounter (Signed)
Called and spoke with patient. She stated that she woke up this morning with a fever of 100F. She has had some yellow nasal drainage and believes she has a sinus infection. Her son and grandchildren had similar symptoms last week. She had an appt for 2pm in office but stated she did not feel like coming in. I offered an mychart video visit and she accepted. I was able to get her rescheduled for the same time. She is aware of the instructions to log on for this visit.   Will route to Joellen Jersey so she is aware.

## 2022-10-10 NOTE — Telephone Encounter (Signed)
Needs to flu and COVID test. Thanks!

## 2022-10-10 NOTE — Telephone Encounter (Signed)
PT calling to cancel todays appt w/Ms. Cobb. Too sick to come in  Fever Drainage Poss Sinus Infection  Did not want me to hold appt. Did want call from Triage.   [phone]

## 2022-10-10 NOTE — Patient Instructions (Signed)
Continue Trelegy 1 puff daily. Brush tongue and rinse mouth afterwards Continue Albuterol inhaler 2 puffs every 6 hours as needed for shortness of breath or wheezing. Notify if symptoms persist despite rescue inhaler/neb use.   Medrol dosepak to start should you develop shortness of breath or wheezing. Call me if you have to start taking this Guaifenesin [phone] Twice daily for cough/congestion Flonase nasal spray 2 sprays each nostril daily Saline rinses 1-2 times a day before flonase  Can use tylenol over the counter for fevers/headaches  If you do not feel better after 7-10 days or your symptoms worsen, please call us  If your symptoms rapidly worsen, you have trouble talking or extreme difficulty breathing, or you're not getting relief from your rescue, go to the emergency department.   Follow up in February after CT chest with Dr. Valeta Harms or Joellen Jersey Masiyah Jorstad,NP. If symptoms do not improve or worsen, please contact office for sooner follow up or seek emergency care.

## 2022-10-10 NOTE — Progress Notes (Signed)
Patient ID: Shelley [name], female     DOB: August 31, 1956, 67 y.o.      MRN: [id number]  Chief Complaint  Patient presents with   Follow-up    Virtual Visit via Video Note  I connected with Rosemarie Beath on 10/10/22 at  2:00 PM EST by a video enabled telemedicine application and verified that I am speaking with the correct person using two identifiers.  Location: Patient: Home Provider: Office   I discussed the limitations of evaluation and management by telemedicine and the availability of in person appointments. The patient expressed understanding and agreed to proceed.  History of Present Illness: 67 year old female, former smoker followed for lung nodule. She is a patient of Dr. Juline Patch and last seen in office 09/19/2022 by [facility]. Past medical history significant for migraines, hep C post treatment, fibromyalgia, PTSD, anxiety.    TEST/EVENTS:  05/06/2022 CT chest outside images: nodular opacity in the LLL. Moderate emphysema    06/10/2022: OV with Dr. Valeta Harms for initial consult. Referred for abnormal lung cancer screening CT that showed nodular/rounded opacity in the posterior portion of the LLL. Plan for repeat CT chest in 3 months. PCP started her on Trelegy for DOE; unsure if it's helping. Will need formal PFTs.   09/19/2022: OV with [facility] Henzler NP for follow up, which was supposed to be for review of her CT chest. She just had this completed 12/19 and results are not available yet. She tells me that she was sick around 11/3 with some sort of virus. Flu and COVID were negative. She had a very bad cough, started coughing up blood, had high fevers up to 103, vomiting, and diarrhea. She had significant fatigue and weakness. She did not want to go to the ED so she stayed at home. Her PCP did treat her with a z pack. Never completed any imaging prior to her CT 12/19. Today, she tells me she is feeling better but still fatigued and doesn't feel quite back to her baseline. She is also continuing to  have a productive cough with yellow sputum. Breathing feels a little more short winded than her baseline but overall, improved. She has not had any further episodes of hemoptysis or recurrence of fevers. Eating and drinking well now; able to keep fluids/food down without trouble. She did stop her Trelegy because she was having trouble with a sore mouth. This resolved after she came off the inhaler.  Treated for emphysema exacerbation and potential unresolved pna with levaquin 7 day course and medrol dosepak. Restarted trelegy for maintenance. Awaiting CT scan results.   10/10/2022: Today - follow up Patient presents today for intended follow-up but unfortunately is having some acute symptoms.  She tells me that around 2 days ago, she started with nasal congestion and runny nose.  Felt like she had some allergies.  She then woke up this morning with a fever of 100F and sinus drainage seems to be more purulent.  She did take 2 COVID test which were negative.  Her son and grandchildren had similar symptoms last week.  They were all negative for COVID and the flu.  No other viral testing completed.  Other than her sinus symptoms, she has some headaches but breathing feels stable.  No significant cough.  Feels like she has postnasal drainage.  She was actually feeling much better after her last course of Levaquin and Medrol Dosepak.  Denies any wheezing, hemoptysis, chest congestion.  Eating and drinking well.  Continues on Trelegy  inhaler.  Allergies  Allergen Reactions   Penicillins Anaphylaxis   Lyrica [Pregabalin] Other (See Comments)    Walks into walls with this med   Codeine Rash   Tramadol Nausea And Vomiting and Rash   Immunization History  Administered Date(s) Administered   Moderna Sars-Covid-2 Vaccination 01/26/2020, 09/15/2020   Past Medical History:  Diagnosis Date   Anxiety    Chronic migraine    Depression    Fibromyalgia    Hepatitis C    IBS (irritable bowel syndrome)      Tobacco History: Social History   Tobacco Use  Smoking Status Former   Packs/day: 1.00   Years: 20.00   Total pack years: 20.00   Types: Cigarettes  Smokeless Tobacco Never  Tobacco Comments   uses smoke stick, just switched to half pack a day 7.10.14   Counseling given: Not Answered Tobacco comments: uses smoke stick, just switched to half pack a day 7.10.14   Outpatient Medications Prior to Visit  Medication Sig Dispense Refill   albuterol (VENTOLIN HFA) 108 (90 Base) MCG/ACT inhaler Inhale 2 puffs into the lungs every 6 (six) hours as needed for wheezing or shortness of breath. 8 g 6   diazepam (VALIUM) 10 MG tablet Take 1 tablet (10 mg total) by mouth 4 (four) times daily. 120 tablet 5   Fluticasone-Umeclidin-Vilant (TRELEGY ELLIPTA) 100-62.5-25 MCG/ACT AEPB Inhale 1 each into the lungs at bedtime. 1 each 6   levofloxacin (LEVAQUIN) 500 MG tablet Take 1 tablet (500 mg total) by mouth daily. 7 tablet 0   sertraline (ZOLOFT) 100 MG tablet Take 2 tablets (200 mg total) by mouth daily. 180 tablet 1   VITAMIN D, CHOLECALCIFEROL, PO Take by mouth.     methylPREDNISolone (MEDROL DOSEPAK) 4 MG TBPK tablet Take as prescribed 1 each 0   No facility-administered medications prior to visit.     Review of Systems:   Constitutional: No weight loss or gain, night sweats, chills,or lassitude. +fevers, fatigue HEENT: No difficulty swallowing, tooth/dental problems, or sore throat. No sneezing, itching, ear ache. +nasal congestion, purulent drainage, post nasal drip, headaches CV:  No chest pain, orthopnea, PND, swelling in lower extremities, anasarca, dizziness, palpitations, syncope Resp: No shortness of breath with exertion or at rest. No excess mucus or change in color of mucus. No productive or non-productive. No hemoptysis. No wheezing.  No chest wall deformity GI:  No heartburn, indigestion, abdominal pain, nausea, vomiting, diarrhea, loss of appetite GU: No dysuria, change in  color of urine, urgency or frequency Skin: No rash, lesions, ulcerations MSK:  No joint pain or swelling.   Neuro: No dizziness or lightheadedness.  Psych: No depression or anxiety. Mood stable.   Observations/Objective: Patient is well-developed, well-nourished in no acute distress. A&Ox3. Resting comfortably at home. Unlabored, regular breathing. Speech is clear and coherent with logical content.    Assessment and Plan: URI (upper respiratory infection) Viral illness with recent sick exposures. Home COVID testing negative. Supportive care measures advised. We discussed that her symptoms are viral in nature and do not warrant an abx at this time. She will notify if symptoms worsen or do not improve. She is currently not having any lower respiratory symptoms and breathing is stable; however, given her history and concern for underlying COPD, I am sending a medrol dosepak for her to have on hand should she develop increased shortness of breath or wheezing. ED precautions advised should she develop acute worsening.   Patient Instructions  Continue Trelegy 1 puff  daily. Brush tongue and rinse mouth afterwards Continue Albuterol inhaler 2 puffs every 6 hours as needed for shortness of breath or wheezing. Notify if symptoms persist despite rescue inhaler/neb use.   Medrol dosepak to start should you develop shortness of breath or wheezing. Call me if you have to start taking this Guaifenesin [phone] mg Twice daily for cough/congestion Flonase nasal spray 2 sprays each nostril daily Saline rinses 1-2 times a day before flonase  Can use tylenol over the counter for fevers/headaches  If you do not feel better after 7-10 days or your symptoms worsen, please call us  If your symptoms rapidly worsen, you have trouble talking or extreme difficulty breathing, or you're not getting relief from your rescue, go to the emergency department.   Follow up in February after CT chest with Dr. Valeta Harms or Joellen Jersey  Ona Roehrs,NP. If symptoms do not improve or worsen, please contact office for sooner follow up or seek emergency care.    Centrilobular emphysema (HCC) Emphysema on imaging with suspected underlying obstructive lung disease. She has yet to undergo formal pulmonary function testing. Benefits from use of Trelegy. Action plan in place.   CAP (community acquired pneumonia) CT chest from December 2023 with pna. She was treated with 7 day course of levaquin. She clinically improved and was stable for 2 weeks. Symptoms today most consistent with viral illness and not recurrent pna. Strict return precautions. Plans for repeat CT chest in February to ensure resolution and follow up on nodules.    I discussed the assessment and treatment plan with the patient. The patient was provided an opportunity to ask questions and all were answered. The patient agreed with the plan and demonstrated an understanding of the instructions.   The patient was advised to call back or seek an in-person evaluation if the symptoms worsen or if the condition fails to improve as anticipated.  I provided 35 minutes of non-face-to-face time during this encounter.   Clayton Bibles, NP

## 2022-10-10 NOTE — Assessment & Plan Note (Signed)
Viral illness with recent sick exposures. Home COVID testing negative. Supportive care measures advised. We discussed that her symptoms are viral in nature and do not warrant an abx at this time. She will notify if symptoms worsen or do not improve. She is currently not having any lower respiratory symptoms and breathing is stable; however, given her history and concern for underlying COPD, I am sending a medrol dosepak for her to have on hand should she develop increased shortness of breath or wheezing. ED precautions advised should she develop acute worsening.   Patient Instructions  Continue Trelegy 1 puff daily. Brush tongue and rinse mouth afterwards Continue Albuterol inhaler 2 puffs every 6 hours as needed for shortness of breath or wheezing. Notify if symptoms persist despite rescue inhaler/neb use.   Medrol dosepak to start should you develop shortness of breath or wheezing. Call me if you have to start taking this Guaifenesin [phone] mg Twice daily for cough/congestion Flonase nasal spray 2 sprays each nostril daily Saline rinses 1-2 times a day before flonase  Can use tylenol over the counter for fevers/headaches  If you do not feel better after 7-10 days or your symptoms worsen, please call us  If your symptoms rapidly worsen, you have trouble talking or extreme difficulty breathing, or you're not getting relief from your rescue, go to the emergency department.   Follow up in February after CT chest with Dr. Valeta Harms or Shelley Jersey Asencion Loveday,NP. If symptoms do not improve or worsen, please contact office for sooner follow up or seek emergency care.

## 2022-10-17 ENCOUNTER — Ambulatory Visit (INDEPENDENT_AMBULATORY_CARE_PROVIDER_SITE_OTHER): Payer: Medicare HMO | Admitting: Psychiatry

## 2022-10-17 ENCOUNTER — Encounter: Payer: Self-pay | Admitting: Psychiatry

## 2022-10-17 DIAGNOSIS — F4310 Post-traumatic stress disorder, unspecified: Secondary | ICD-10-CM | POA: Diagnosis not present

## 2022-10-17 DIAGNOSIS — F3342 Major depressive disorder, recurrent, in full remission: Secondary | ICD-10-CM | POA: Diagnosis not present

## 2022-10-17 DIAGNOSIS — F4001 Agoraphobia with panic disorder: Secondary | ICD-10-CM | POA: Diagnosis not present

## 2022-10-17 MED ORDER — DIAZEPAM 10 MG PO TABS: 10.0000 mg | ORAL_TABLET | Freq: Four times a day (QID) | ORAL | 5 refills | Status: DC

## 2022-10-17 MED ORDER — SERTRALINE HCL 100 MG PO TABS: 200.0000 mg | ORAL_TABLET | Freq: Every day | ORAL | 2 refills | Status: DC
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
## 2022-11-01 ENCOUNTER — Ambulatory Visit (HOSPITAL_COMMUNITY)
Admission: RE | Admit: 2022-11-01 | Discharge: 2022-11-01 | Disposition: A | Discharge status: Home / Self Care | Payer: Medicare HMO | Source: Ambulatory Visit | Attending: Pulmonary Disease | Admitting: Pulmonary Disease

## 2022-11-01 DIAGNOSIS — R911 Solitary pulmonary nodule: Secondary | ICD-10-CM | POA: Insufficient documentation

## 2022-11-15 ENCOUNTER — Other Ambulatory Visit: Payer: Self-pay

## 2022-11-15 DIAGNOSIS — R911 Solitary pulmonary nodule: Secondary | ICD-10-CM

## 2023-05-06 ENCOUNTER — Other Ambulatory Visit: Payer: Self-pay | Admitting: Psychiatry

## 2023-05-06 ENCOUNTER — Telehealth: Payer: Self-pay | Admitting: Psychiatry

## 2023-05-06 DIAGNOSIS — F4001 Agoraphobia with panic disorder: Secondary | ICD-10-CM

## 2023-05-06 NOTE — Telephone Encounter (Signed)
Pended.

## 2023-05-06 NOTE — Telephone Encounter (Signed)
Next appt is 05/14/23. Requesting refill on Diazepam called to:  [id number] - , [place] - [address] AT [facility] [address] [facility] [address]   Phone: [phone]  Fax: [phone]

## 2023-05-14 ENCOUNTER — Encounter: Payer: Self-pay | Admitting: Psychiatry

## 2023-05-14 ENCOUNTER — Ambulatory Visit: Payer: Medicare HMO | Admitting: Psychiatry

## 2023-05-14 DIAGNOSIS — F4001 Agoraphobia with panic disorder: Secondary | ICD-10-CM

## 2023-05-14 DIAGNOSIS — F3342 Major depressive disorder, recurrent, in full remission: Secondary | ICD-10-CM | POA: Diagnosis not present

## 2023-05-14 DIAGNOSIS — F4310 Post-traumatic stress disorder, unspecified: Secondary | ICD-10-CM

## 2023-05-14 MED ORDER — DIAZEPAM 10 MG PO TABS: 10.0000 mg | ORAL_TABLET | Freq: Four times a day (QID) | ORAL | 5 refills | Status: DC

## 2023-05-14 MED ORDER — SERTRALINE HCL 100 MG PO TABS: 200.0000 mg | ORAL_TABLET | Freq: Every day | ORAL | 2 refills | Status: DC
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
## 2023-06-16 ENCOUNTER — Other Ambulatory Visit: Payer: Self-pay | Admitting: Family Medicine

## 2023-06-16 ENCOUNTER — Encounter: Payer: Self-pay | Admitting: Family Medicine

## 2023-06-16 DIAGNOSIS — E041 Nontoxic single thyroid nodule: Secondary | ICD-10-CM

## 2023-06-20 ENCOUNTER — Ambulatory Visit
Admission: RE | Admit: 2023-06-20 | Discharge: 2023-06-20 | Disposition: A | Discharge status: Home / Self Care | Payer: Medicare HMO | Source: Ambulatory Visit | Attending: Family Medicine | Admitting: Family Medicine

## 2023-06-20 DIAGNOSIS — E041 Nontoxic single thyroid nodule: Secondary | ICD-10-CM

## 2023-10-31 ENCOUNTER — Other Ambulatory Visit: Payer: Self-pay | Admitting: Nurse Practitioner

## 2023-10-31 DIAGNOSIS — J441 Chronic obstructive pulmonary disease with (acute) exacerbation: Secondary | ICD-10-CM

## 2023-11-03 ENCOUNTER — Encounter (HOSPITAL_COMMUNITY): Payer: Self-pay

## 2023-11-03 ENCOUNTER — Ambulatory Visit (HOSPITAL_COMMUNITY): Payer: Self-pay

## 2023-11-04 ENCOUNTER — Other Ambulatory Visit: Payer: Self-pay | Admitting: Acute Care

## 2023-11-04 DIAGNOSIS — R911 Solitary pulmonary nodule: Secondary | ICD-10-CM

## 2023-11-05 ENCOUNTER — Ambulatory Visit: Payer: Medicare HMO | Admitting: Psychiatry

## 2023-11-17 ENCOUNTER — Encounter: Payer: Self-pay | Admitting: Psychiatry

## 2023-11-17 ENCOUNTER — Ambulatory Visit (INDEPENDENT_AMBULATORY_CARE_PROVIDER_SITE_OTHER): Payer: HMO | Admitting: Psychiatry

## 2023-11-17 DIAGNOSIS — F4001 Agoraphobia with panic disorder: Secondary | ICD-10-CM | POA: Diagnosis not present

## 2023-11-17 DIAGNOSIS — F3342 Major depressive disorder, recurrent, in full remission: Secondary | ICD-10-CM

## 2023-11-17 DIAGNOSIS — F4310 Post-traumatic stress disorder, unspecified: Secondary | ICD-10-CM

## 2023-11-17 MED ORDER — SERTRALINE HCL 100 MG PO TABS: 200.0000 mg | ORAL_TABLET | Freq: Every day | ORAL | 3 refills | Status: AC

## 2023-11-17 MED ORDER — DIAZEPAM 10 MG PO TABS: 10.0000 mg | ORAL_TABLET | Freq: Four times a day (QID) | ORAL | 5 refills | Status: DC
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
## 2023-11-19 ENCOUNTER — Ambulatory Visit (HOSPITAL_COMMUNITY): Payer: PPO

## 2023-11-26 ENCOUNTER — Ambulatory Visit (HOSPITAL_COMMUNITY)
Admission: RE | Admit: 2023-11-26 | Discharge: 2023-11-26 | Disposition: A | Discharge status: Home / Self Care | Payer: PPO | Source: Ambulatory Visit | Attending: Acute Care | Admitting: Acute Care

## 2023-11-26 ENCOUNTER — Encounter (HOSPITAL_COMMUNITY): Payer: Self-pay

## 2023-11-26 DIAGNOSIS — Z122 Encounter for screening for malignant neoplasm of respiratory organs: Secondary | ICD-10-CM | POA: Diagnosis present

## 2023-11-26 DIAGNOSIS — F17210 Nicotine dependence, cigarettes, uncomplicated: Secondary | ICD-10-CM | POA: Diagnosis not present

## 2023-11-26 DIAGNOSIS — J90 Pleural effusion, not elsewhere classified: Secondary | ICD-10-CM | POA: Insufficient documentation

## 2023-11-26 DIAGNOSIS — F17200 Nicotine dependence, unspecified, uncomplicated: Secondary | ICD-10-CM | POA: Insufficient documentation

## 2023-11-26 DIAGNOSIS — J439 Emphysema, unspecified: Secondary | ICD-10-CM | POA: Diagnosis not present

## 2023-11-26 DIAGNOSIS — R911 Solitary pulmonary nodule: Secondary | ICD-10-CM | POA: Insufficient documentation

## 2023-11-26 DIAGNOSIS — I700 Atherosclerosis of aorta: Secondary | ICD-10-CM | POA: Insufficient documentation

## 2023-12-09 ENCOUNTER — Telehealth: Payer: Self-pay | Admitting: Acute Care

## 2023-12-09 NOTE — Telephone Encounter (Signed)
 Returned call, LVM. Advised once final results are received results will be sent to PCP.

## 2023-12-09 NOTE — Telephone Encounter (Signed)
 Patient doctor, Dois Davenport, MD will be requesting a copy of the patients CT scan.

## 2023-12-25 ENCOUNTER — Other Ambulatory Visit: Payer: Self-pay

## 2023-12-25 ENCOUNTER — Telehealth: Payer: Self-pay

## 2023-12-25 DIAGNOSIS — F17210 Nicotine dependence, cigarettes, uncomplicated: Secondary | ICD-10-CM

## 2023-12-25 DIAGNOSIS — Z122 Encounter for screening for malignant neoplasm of respiratory organs: Secondary | ICD-10-CM

## 2023-12-25 DIAGNOSIS — Z87891 Personal history of nicotine dependence: Secondary | ICD-10-CM

## 2023-12-25 NOTE — Telephone Encounter (Signed)
 Patient had LDCT on 11/26/2023 that resulted on 12/21/2023 as a LR2 with a noted small to moderate basilar right pleural effusion that has increase since 11/01/2022 CT. Results reviewed by Kandice Robinsons NP. Patient does not have a cardiologist per her chart. Per Maralyn Sago, patient will need to be informed of results and ask if she is ShOB or any other worsening respiratory symptoms. Patient has seen Rhunette Croft NP in the past. Patient will need to be evaluated in office. Can see if pt would like to follow up with pulm or PCP, Dr. Hal Hope. Will need to help facilitate appt with which every office has sooner follow up available and pts preference. Patient can resume annual LCS, due 11/26/2024.      IMPRESSION: 1. Lung-RADS 2, benign appearance or behavior. Continue annual screening with low-dose chest CT without contrast in 12 months. 2. Small to moderate basilar right pleural effusion, increased from 11/01/2022 CT. 3. Aortic Atherosclerosis (ICD10-I70.0) and Emphysema (ICD10-J43.9).     Electronically Signed   By: Delbert Phenix M.D.   On: 12/21/2023 12:00

## 2023-12-25 NOTE — Telephone Encounter (Signed)
 Copied from CRM [id number]. Topic: Clinical - Lab/Test Results >> Dec 25, 2023  2:56 PM Shelley [name] wrote: Reason for CRM: Patient states she already signed the release form to release her CT of her lungs to her PCP Dr.Karen Hal Hope. Patient is requesting for [place] to send [name] copy of her results.   Requesting [name] call back - [phone]   I called and spoke to pt. Pt was requesting that her CT results be sent to her PCP. I informed pt that we could route this to her PCP, she just needed to check with Dr Aniceto Boss to make sure she received it. Pt verbalized understanding. [facility]  [phone] DR Hal Hope fax number

## 2023-12-25 NOTE — Telephone Encounter (Signed)
 Spoke with patient and reviewed results. Advised CT resulted as a LR2 with a noted small to moderate basilar right pleural effusion that has increase since 11/01/2022 CT. Patient does not have a cardiologist however she will f/u with PCP Dr. Aniceto Boss and request referral if needed. Pt denies shortness of breath today. Advises she has not done much though and she occasionally has shortness of breath. Pt aware that she should f/u with PCP and if new or worsening shortness of breath occurs she will go to the ER. Per patient she does not need assistance setting an appt up. She states she sees Dr. Aniceto Boss regularly and can get in quickly. Results and plan sent to PCP.

## 2024-05-17 ENCOUNTER — Telehealth (INDEPENDENT_AMBULATORY_CARE_PROVIDER_SITE_OTHER): Payer: Self-pay | Admitting: Psychiatry

## 2024-05-17 ENCOUNTER — Encounter: Payer: Self-pay | Admitting: Psychiatry

## 2024-05-17 DIAGNOSIS — F4310 Post-traumatic stress disorder, unspecified: Secondary | ICD-10-CM | POA: Diagnosis not present

## 2024-05-17 DIAGNOSIS — F4001 Agoraphobia with panic disorder: Secondary | ICD-10-CM | POA: Diagnosis not present

## 2024-05-17 DIAGNOSIS — F3342 Major depressive disorder, recurrent, in full remission: Secondary | ICD-10-CM | POA: Diagnosis not present

## 2024-05-17 MED ORDER — DIAZEPAM 10 MG PO TABS: 10.0000 mg | ORAL_TABLET | Freq: Four times a day (QID) | ORAL | 5 refills | Status: AC
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
## 2024-07-15 ENCOUNTER — Telehealth (HOSPITAL_COMMUNITY): Payer: Self-pay

## 2024-07-15 NOTE — Telephone Encounter (Signed)
 Outside/paper referral received by Dr. Burney from [facility]. Insurance benefits and eligibility to be determined.   Attempted to call patient regarding interest in pulmonary rehab- no answer, left message. Sent MyChart message.

## 2024-07-16 ENCOUNTER — Telehealth (HOSPITAL_COMMUNITY): Payer: Self-pay

## 2024-07-16 NOTE — Telephone Encounter (Signed)
 Patient called back confirming interest in pulmonary rehab, states she should have [employer] insurance but it is listed as not active coverage. Will verify her insurance benefits and reach back out to her.

## 2024-07-23 ENCOUNTER — Telehealth (HOSPITAL_COMMUNITY): Payer: Self-pay

## 2024-07-23 NOTE — Telephone Encounter (Signed)
 Pt insurance is active and benefits verified through [employer]. Co-pay $5, DED $0/$0 met, out of pocket $3,400/$10 met, co-insurance 0%. No pre-authorization required. 07/23/2024 @ 8:53am, spoke with [place], REF# [id number].

## 2024-11-18 ENCOUNTER — Ambulatory Visit: Admitting: Psychiatry
